# Patient Record
Sex: Male | Born: 1955
Health system: Southern US, Community
[De-identification: ages and names within clinical notes are randomized; demographics above are authoritative.]

## PROBLEM LIST (undated history)

## (undated) DIAGNOSIS — I10 Essential (primary) hypertension: Secondary | ICD-10-CM

## (undated) DIAGNOSIS — I2510 Atherosclerotic heart disease of native coronary artery without angina pectoris: Secondary | ICD-10-CM

## (undated) DIAGNOSIS — E669 Obesity, unspecified: Secondary | ICD-10-CM

## (undated) DIAGNOSIS — G4730 Sleep apnea, unspecified: Secondary | ICD-10-CM

## (undated) DIAGNOSIS — E785 Hyperlipidemia, unspecified: Secondary | ICD-10-CM

## (undated) HISTORY — DX: Obesity, unspecified: E66.9

## (undated) HISTORY — DX: Atherosclerotic heart disease of native coronary artery without angina pectoris: I25.10

## (undated) HISTORY — DX: Essential (primary) hypertension: I10

## (undated) HISTORY — DX: Hyperlipidemia, unspecified: E78.5

---

## 1986-01-04 HISTORY — PX: OTHER SURGICAL HISTORY: SHX169

## 2003-08-07 ENCOUNTER — Other Ambulatory Visit: Payer: Self-pay

## 2008-03-21 ENCOUNTER — Ambulatory Visit: Payer: Self-pay | Admitting: Internal Medicine

## 2008-03-21 HISTORY — PX: OTHER SURGICAL HISTORY: SHX169

## 2008-08-09 ENCOUNTER — Emergency Department: Payer: Self-pay | Admitting: Emergency Medicine

## 2008-11-01 ENCOUNTER — Ambulatory Visit: Payer: Self-pay | Admitting: Unknown Physician Specialty

## 2008-11-01 LAB — HM COLONOSCOPY

## 2009-01-15 LAB — PSA: PSA: 0.4

## 2010-02-03 IMAGING — CT CT HEAD WITHOUT CONTRAST
2 series · 16 of 30 positions shown, 20 images · non-contrast
Comparison: none

REASON FOR EXAM: board fell on head, takes Plavix, +nose bleed
(controlled)
COMMENTS:

PROCEDURE:     CT  - CT HEAD WITHOUT CONTRAST  - August 09, 2008  [DATE]
RESULT:     Comparison:  None
TECHNIQUE: Multiple axial images from the foramen magnum to the vertex were
obtained without IV contrast.

[Series 2: without · axial · non-contrast · 0.45mm/px · z∈[-150,-5]mm · 13 of 35 slices shown, 17 images]
[im 3/35  brain]
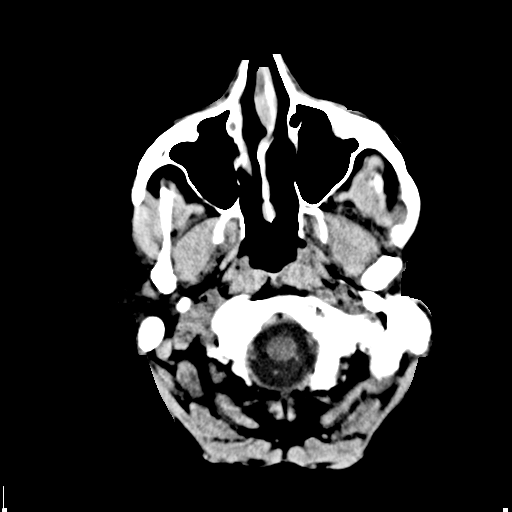
[im 3/35  bone]
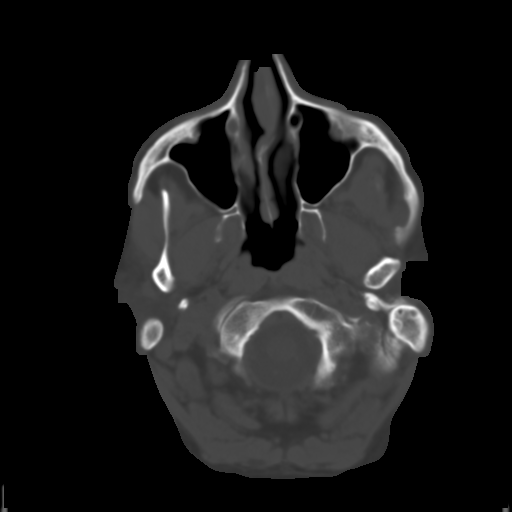
[im 5/35  brain]
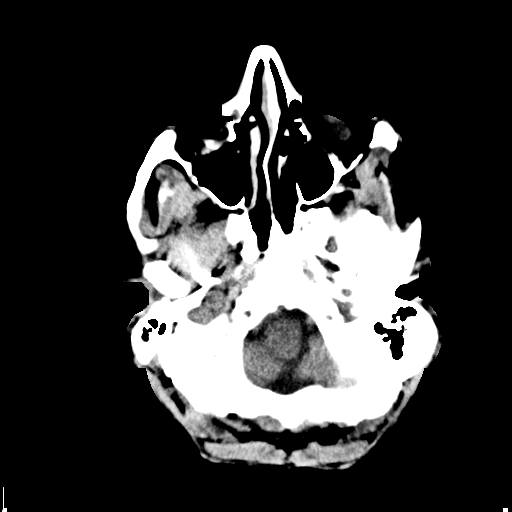
[im 8/35  brain]
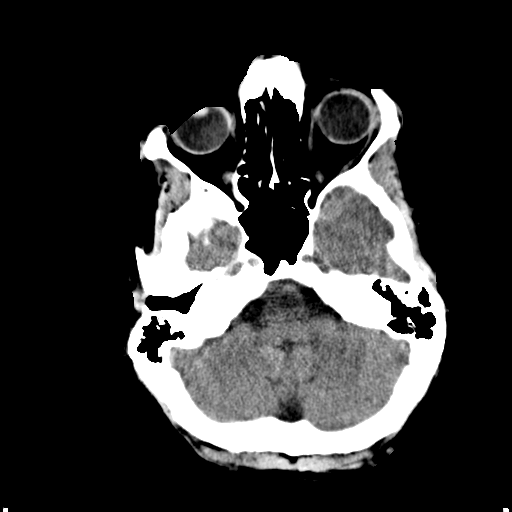
[im 10/35  brain]
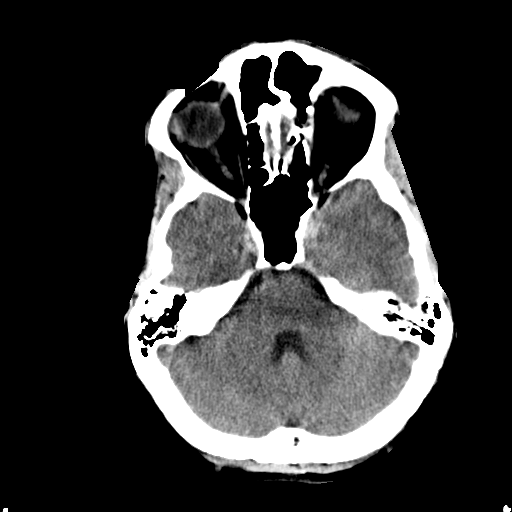
[im 13/35  brain]
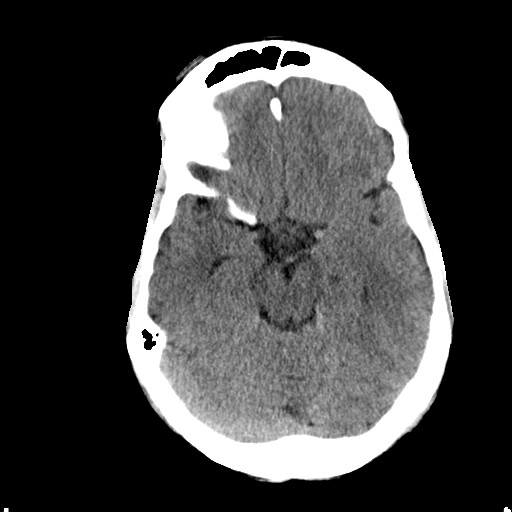
[im 13/35  bone]
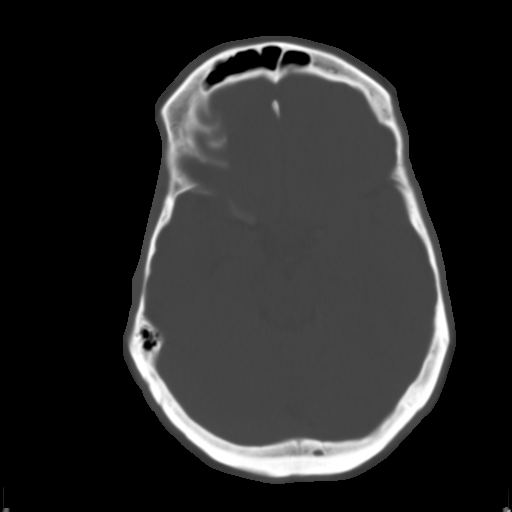
[im 15/35  brain]
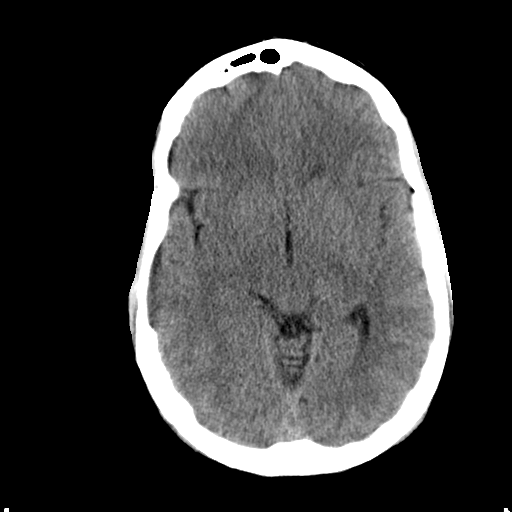
[im 18/35  brain]
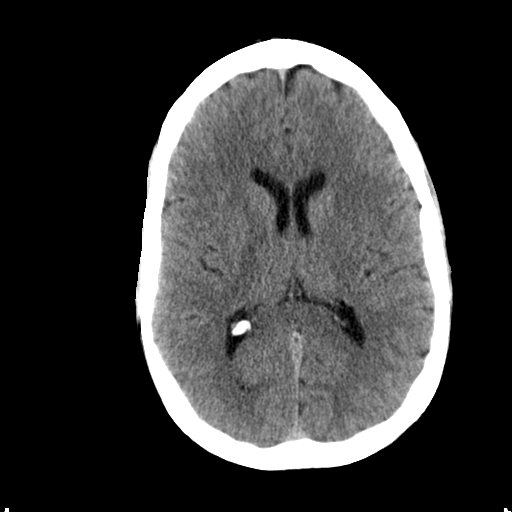
[im 20/35  brain]
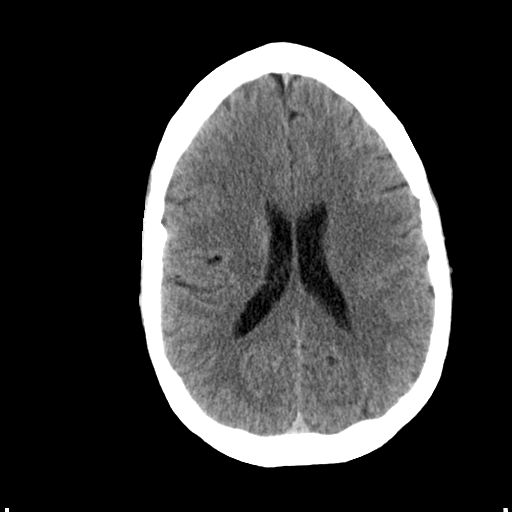
[im 22/35  brain]
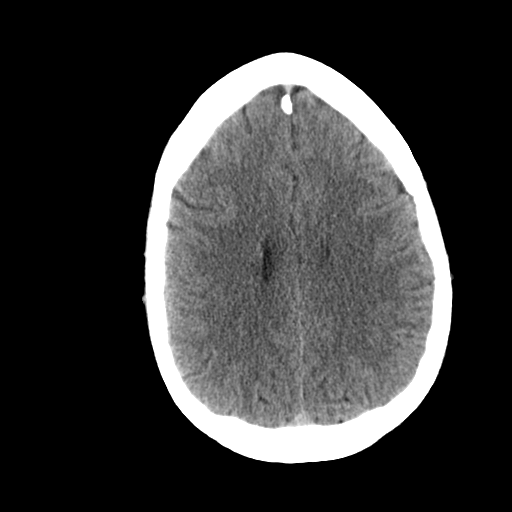
[im 22/35  bone]
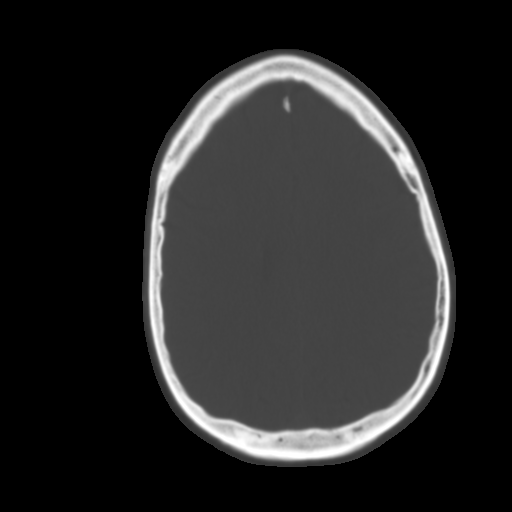
[im 25/35  brain]
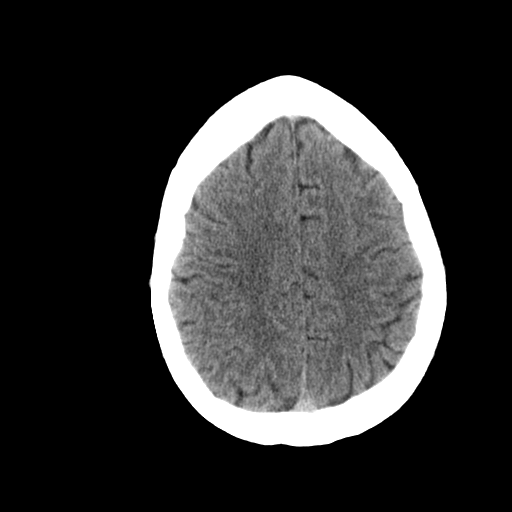
[im 27/35  brain]
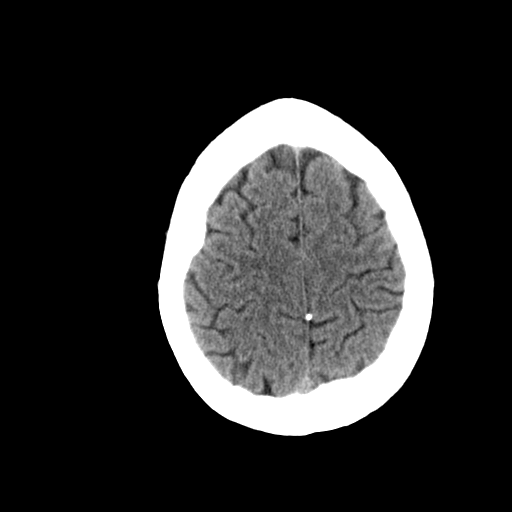
[im 30/35  brain]
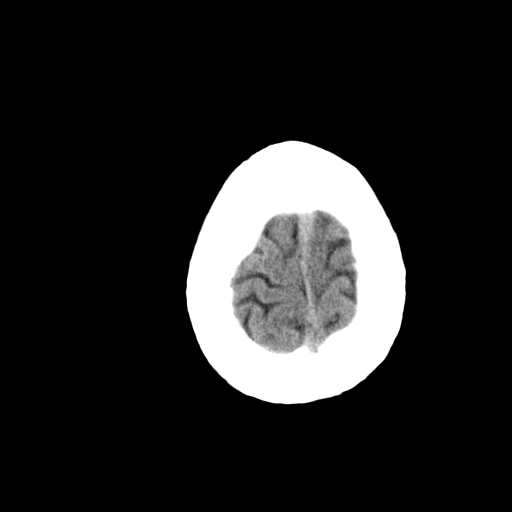
[im 32/35  brain]
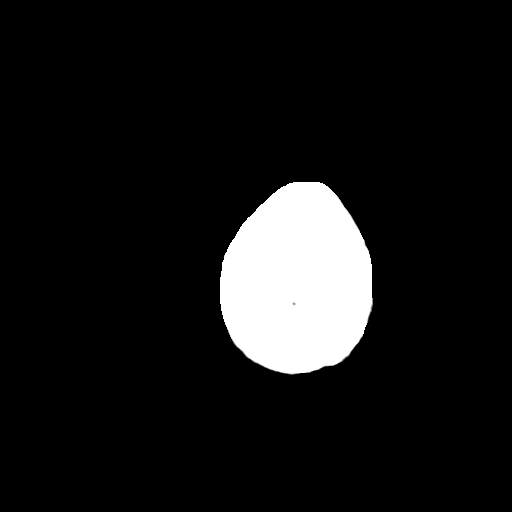
[im 32/35  bone]
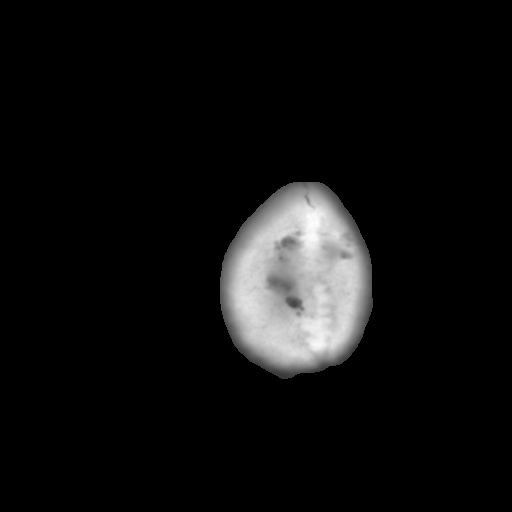

[Series 3: bone · axial · 0.45mm/px · z∈[-150,-100]mm · 3 of 35 slices shown]
[im 3/35  bone]
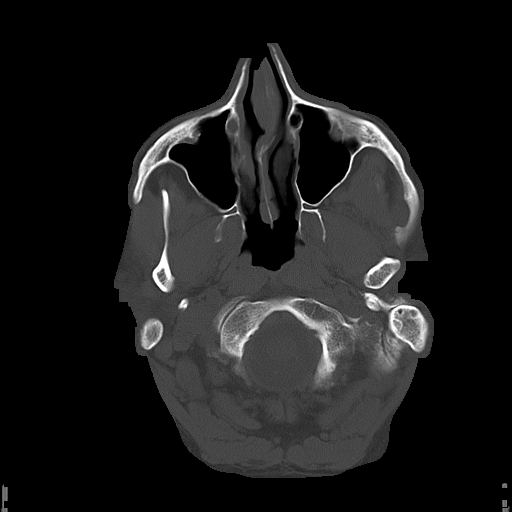
[im 8/35  bone]
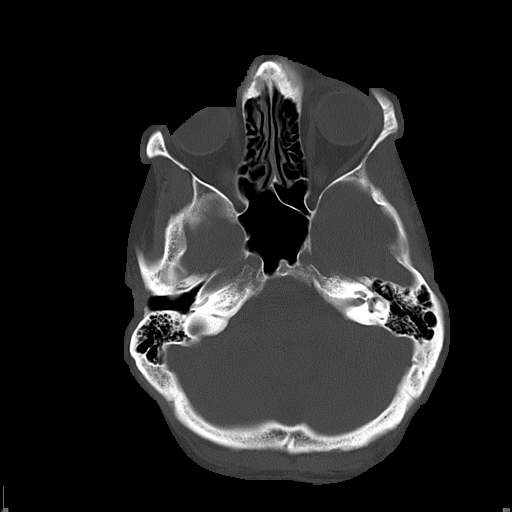
[im 13/35  bone]
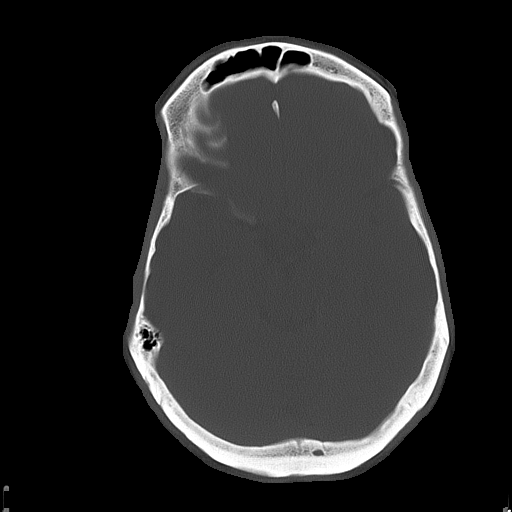

[16 of 30 positions shown; findings below may reference images not displayed]

FINDINGS: There is no evidence of mass effect, midline shift, or extra-axial fluid
collections.  There is no evidence of a space-occupying lesion or
intracranial hemorrhage. There is no evidence of a cortical-based area of
acute infarction.

The ventricles and sulci are appropriate for the patient's age. The basal
cisterns are patent.

Visualized portions of the orbits are unremarkable. The paranasal sinuses
and mastoid air cells are unremarkable.

The osseous structures are unremarkable.
IMPRESSION: No acute intracranial process.

## 2013-02-09 LAB — CBC AND DIFFERENTIAL
HCT: 43 % (ref 41–53)
Hemoglobin: 15 g/dL (ref 13.5–17.5)
Platelets: 231 10*3/uL (ref 150–399)
WBC: 7 10^3/mL

## 2013-02-09 LAB — LIPID PANEL
CHOLESTEROL: 139 mg/dL (ref 0–200)
HDL: 50 mg/dL (ref 35–70)
LDL Cholesterol: 73 mg/dL
TRIGLYCERIDES: 78 mg/dL (ref 40–160)

## 2013-02-09 LAB — HEPATIC FUNCTION PANEL
ALT: 25 U/L (ref 10–40)
AST: 31 U/L (ref 14–40)

## 2013-02-09 LAB — BASIC METABOLIC PANEL
BUN: 20 mg/dL (ref 4–21)
Creatinine: 1.2 mg/dL (ref 0.6–1.3)
Glucose: 89 mg/dL
POTASSIUM: 4.7 mmol/L (ref 3.4–5.3)
SODIUM: 140 mmol/L (ref 137–147)

## 2013-02-09 LAB — TSH: TSH: 1.87 u[IU]/mL (ref 0.41–5.90)

## 2014-07-18 ENCOUNTER — Other Ambulatory Visit: Payer: Self-pay | Admitting: Family Medicine

## 2014-07-18 NOTE — Telephone Encounter (Signed)
Patient has not been seen since Feb 2015.  Do you want him to have any?

## 2014-07-19 NOTE — Telephone Encounter (Signed)
Needs appointment within 3 months

## 2014-07-22 ENCOUNTER — Other Ambulatory Visit: Payer: Self-pay | Admitting: Family Medicine

## 2014-10-22 ENCOUNTER — Ambulatory Visit (INDEPENDENT_AMBULATORY_CARE_PROVIDER_SITE_OTHER): Payer: BLUE CROSS/BLUE SHIELD | Admitting: Family Medicine

## 2014-10-22 ENCOUNTER — Encounter: Payer: Self-pay | Admitting: Family Medicine

## 2014-10-22 VITALS — BP 114/72 | HR 72 | Temp 97.8°F | Resp 16 | Ht 74.0 in | Wt 245.0 lb

## 2014-10-22 DIAGNOSIS — G4733 Obstructive sleep apnea (adult) (pediatric): Secondary | ICD-10-CM | POA: Insufficient documentation

## 2014-10-22 DIAGNOSIS — N529 Male erectile dysfunction, unspecified: Secondary | ICD-10-CM | POA: Insufficient documentation

## 2014-10-22 DIAGNOSIS — Z23 Encounter for immunization: Secondary | ICD-10-CM

## 2014-10-22 DIAGNOSIS — I2510 Atherosclerotic heart disease of native coronary artery without angina pectoris: Secondary | ICD-10-CM | POA: Insufficient documentation

## 2014-10-22 DIAGNOSIS — I10 Essential (primary) hypertension: Secondary | ICD-10-CM | POA: Insufficient documentation

## 2014-10-22 DIAGNOSIS — E785 Hyperlipidemia, unspecified: Secondary | ICD-10-CM | POA: Insufficient documentation

## 2014-10-22 DIAGNOSIS — Z1211 Encounter for screening for malignant neoplasm of colon: Secondary | ICD-10-CM | POA: Diagnosis not present

## 2014-10-22 DIAGNOSIS — B009 Herpesviral infection, unspecified: Secondary | ICD-10-CM | POA: Insufficient documentation

## 2014-10-22 DIAGNOSIS — E669 Obesity, unspecified: Secondary | ICD-10-CM | POA: Insufficient documentation

## 2014-10-22 DIAGNOSIS — Z125 Encounter for screening for malignant neoplasm of prostate: Secondary | ICD-10-CM | POA: Diagnosis not present

## 2014-10-22 DIAGNOSIS — Z0000 Encounter for general adult medical examination without abnormal findings: Secondary | ICD-10-CM | POA: Diagnosis not present

## 2014-10-22 LAB — POCT URINALYSIS DIPSTICK
Bilirubin, UA: NEGATIVE
Glucose, UA: NEGATIVE
Ketones, UA: NEGATIVE
Leukocytes, UA: NEGATIVE
Nitrite, UA: NEGATIVE
PH UA: 6
PROTEIN UA: NEGATIVE
RBC UA: NEGATIVE
SPEC GRAV UA: 1.01
UROBILINOGEN UA: NEGATIVE

## 2014-10-22 MED ORDER — CLOPIDOGREL BISULFATE 75 MG PO TABS: 75.0000 mg | ORAL_TABLET | Freq: Every day | ORAL | Status: DC

## 2014-10-22 MED ORDER — ACYCLOVIR 400 MG PO TABS: 400.0000 mg | ORAL_TABLET | Freq: Three times a day (TID) | ORAL | Status: DC

## 2014-10-22 MED ORDER — ROSUVASTATIN CALCIUM 20 MG PO TABS: 20.0000 mg | ORAL_TABLET | Freq: Every day | ORAL | Status: DC

## 2014-10-22 MED ORDER — SILDENAFIL CITRATE 20 MG PO TABS: 20.0000 mg | ORAL_TABLET | Freq: Three times a day (TID) | ORAL | Status: DC

## 2014-10-22 NOTE — Progress Notes (Signed)
Patient ID: BLAYDEN CONWELL, male   DOB: 07-18-1955, 59 y.o.   MRN: [id number] Patient: Frank Hester, Male    DOB: 02-24-1955, 59 y.o.   MRN: [id number] Visit Date: 10/22/2014  Today's Provider: Megan Mans, MD   Chief Complaint  Patient presents with  . Annual Exam   Subjective:  Frank Hester is a 59 y.o. male who presents today for health maintenance and complete physical. He feels well. He reports exercising 2-3 times per week. He reports he is sleeping well.   Review of Systems  Constitutional: Negative for fever, chills, diaphoresis, activity change, appetite change, fatigue and unexpected weight change.  HENT: Positive for mouth sores. Negative for congestion, dental problem, drooling, ear discharge, ear pain, facial swelling, hearing loss, nosebleeds, postnasal drip, rhinorrhea, sinus pressure, sneezing, sore throat, tinnitus, trouble swallowing and voice change.   Eyes: Negative for photophobia, pain, discharge, redness, itching and visual disturbance.  Respiratory: Negative for apnea, cough, choking, chest tightness, shortness of breath, wheezing and stridor.   Cardiovascular: Negative for chest pain, palpitations and leg swelling.  Gastrointestinal: Negative for nausea, vomiting, abdominal pain, diarrhea, constipation, blood in stool, abdominal distention, anal bleeding and rectal pain.  Endocrine: Negative for cold intolerance, heat intolerance, polydipsia, polyphagia and polyuria.  Genitourinary: Negative for dysuria, urgency, frequency, hematuria, flank pain, decreased urine volume, discharge, penile swelling, scrotal swelling, enuresis, difficulty urinating, genital sores, penile pain and testicular pain.  Musculoskeletal: Positive for joint swelling. Negative for myalgias, back pain, arthralgias, gait problem, neck pain and neck stiffness.  Skin: Negative for color change and pallor.  Allergic/Immunologic: Negative for environmental allergies, food allergies  and immunocompromised state.  Neurological: Negative for dizziness, tremors, seizures, syncope, facial asymmetry, speech difficulty, weakness, light-headedness, numbness and headaches.  Hematological: Negative for adenopathy. Does not bruise/bleed easily.  Psychiatric/Behavioral: Negative for suicidal ideas, hallucinations, behavioral problems, confusion, sleep disturbance, self-injury, dysphoric mood, decreased concentration and agitation. The patient is not nervous/anxious and is not hyperactive.     Social History   Social History  . Marital Status: Married    Spouse Name: N/A  . Number of Children: N/A  . Years of Education: N/A   Occupational History  . Not on file.   Social History Main Topics  . Smoking status: Former Games developer  . Smokeless tobacco: Not on file     Comment: Used chewing tobacco, smoked cigarettes, smoked for about 20 years. Quit smoking in the 1990s  . Alcohol Use: No  . Drug Use: No  . Sexual Activity: Not on file   Other Topics Concern  . Not on file   Social History Narrative    Patient Active Problem List   Diagnosis Date Noted  . Arteriosclerosis of coronary artery 10/22/2014  . Herpes simplex 10/22/2014  . Failure of erection 10/22/2014  . HLD (hyperlipidemia) 10/22/2014  . BP (high blood pressure) 10/22/2014  . Adiposity 10/22/2014  . Obstructive apnea 10/22/2014    Past Surgical History  Procedure Laterality Date  . Status post coranary artery stent placement  03/21/2008  . Status post kneesurgery Left 1988    His family history includes Cancer in his maternal grandfather; Heart disease in his father and paternal uncle.    Outpatient Prescriptions Prior to Visit  Medication Sig Dispense Refill  . acyclovir (ZOVIRAX) 400 MG tablet Take by mouth.    Marland Kitchen aspirin 81 MG tablet Take by mouth.    . clopidogrel (PLAVIX) 75 MG tablet Take 75 mg by mouth daily.  12  . CRESTOR 20 MG tablet TAKE 1 TABLET AT BEDTIME 30 tablet 2  . NON FORMULARY  CPAP (device) for sleep apnea    . VIAGRA 100 MG tablet TAKE 1/2 TO 1 TABLET BY MOUTH DAILY AS NEEDED 30 tablet 1   No facility-administered medications prior to visit.    Patient Care Team: Maple Hudsonichard L Milagros Middendorf Jr., MD as PCP - General (Family Medicine)     Objective:   Vitals:  Filed Vitals:   10/22/14 1404  BP: 114/72  Pulse: 72  Temp: 97.8 F (36.6 C)  TempSrc: Oral  Resp: 16  Height: 6\' 2"  (1.88 m)  Weight: 245 lb (111.131 kg)    Physical Exam  Constitutional: He is oriented to person, place, and time. He appears well-developed and well-nourished.  HENT:  Head: Normocephalic and atraumatic.  Right Ear: External ear normal.  Left Ear: External ear normal.  Nose: Nose normal.  Mouth/Throat: Oropharynx is clear and moist.  Eyes: Conjunctivae and EOM are normal. Pupils are equal, round, and reactive to light.  Neck: Normal range of motion. Neck supple.  Cardiovascular: Normal rate, regular rhythm, normal heart sounds and intact distal pulses.   Pulmonary/Chest: Effort normal and breath sounds normal.  Abdominal: Soft. Bowel sounds are normal.  Genitourinary: Rectum normal, prostate normal and penis normal.  Musculoskeletal: Normal range of motion.  Neurological: He is alert and oriented to person, place, and time. He has normal reflexes.  Skin: Skin is warm and dry.  Psychiatric: He has a normal mood and affect. His behavior is normal. Judgment and thought content normal.     Depression Screen No flowsheet data found.    Assessment & Plan:     Routine Health Maintenance and Physical Exam  Exercise Activities and Dietary recommendations Goals    None      Immunization History  Administered Date(s) Administered  . Pneumococcal Polysaccharide-23 09/18/2009  . Tdap 01/15/2009    Health Maintenance  Topic Date Due  . Hepatitis C Screening  10/10/1955  . HIV Screening  06/06/1972  . TETANUS/TDAP  06/06/1976  . COLONOSCOPY  06/07/2007  . INFLUENZA  VACCINE  08/05/2014      Discussed health benefits of physical activity, and encouraged him to engage in regular exercise appropriate for his age and condition.    ------------------------------------------------------------------------------------------------------------

## 2014-10-26 ENCOUNTER — Other Ambulatory Visit: Payer: Self-pay | Admitting: Family Medicine

## 2014-10-28 ENCOUNTER — Encounter: Payer: Self-pay | Admitting: Family Medicine

## 2014-10-29 LAB — COMPREHENSIVE METABOLIC PANEL
A/G RATIO: 1.8 (ref 1.1–2.5)
ALK PHOS: 46 IU/L (ref 39–117)
ALT: 21 IU/L (ref 0–44)
AST: 25 IU/L (ref 0–40)
Albumin: 4.6 g/dL (ref 3.5–5.5)
BILIRUBIN TOTAL: 0.6 mg/dL (ref 0.0–1.2)
BUN/Creatinine Ratio: 11 (ref 9–20)
BUN: 13 mg/dL (ref 6–24)
CHLORIDE: 99 mmol/L (ref 97–106)
CO2: 24 mmol/L (ref 18–29)
Calcium: 9.8 mg/dL (ref 8.7–10.2)
Creatinine, Ser: 1.19 mg/dL (ref 0.76–1.27)
GFR calc non Af Amer: 67 mL/min/{1.73_m2} (ref >59)
GFR, EST AFRICAN AMERICAN: 78 mL/min/{1.73_m2} (ref >59)
GLUCOSE: 95 mg/dL (ref 65–99)
Globulin, Total: 2.5 g/dL (ref 1.5–4.5)
POTASSIUM: 4.9 mmol/L (ref 3.5–5.2)
Sodium: 139 mmol/L (ref 136–144)
Total Protein: 7.1 g/dL (ref 6.0–8.5)

## 2014-10-29 LAB — LIPID PANEL WITH LDL/HDL RATIO
CHOLESTEROL TOTAL: 141 mg/dL (ref 100–199)
HDL: 51 mg/dL (ref >39)
LDL CALC: 64 mg/dL (ref 0–99)
LDl/HDL Ratio: 1.3 ratio units (ref 0.0–3.6)
TRIGLYCERIDES: 130 mg/dL (ref 0–149)
VLDL CHOLESTEROL CAL: 26 mg/dL (ref 5–40)

## 2014-10-29 LAB — TSH: TSH: 1.49 u[IU]/mL (ref 0.450–4.500)

## 2014-10-29 LAB — PSA: Prostate Specific Ag, Serum: 0.5 ng/mL (ref 0.0–4.0)

## 2014-11-05 ENCOUNTER — Other Ambulatory Visit: Payer: Self-pay

## 2015-01-16 ENCOUNTER — Encounter: Payer: Self-pay | Admitting: *Deleted

## 2015-01-17 ENCOUNTER — Ambulatory Visit: Payer: BLUE CROSS/BLUE SHIELD | Admitting: Certified Registered Nurse Anesthetist

## 2015-01-17 ENCOUNTER — Encounter
Admission: RE | Disposition: A | Discharge status: Home / Self Care | Payer: Self-pay | Source: Ambulatory Visit | Attending: Unknown Physician Specialty

## 2015-01-17 ENCOUNTER — Ambulatory Visit
Admission: RE | Admit: 2015-01-17 | Discharge: 2015-01-17 | Disposition: A | Discharge status: Home / Self Care | Payer: BLUE CROSS/BLUE SHIELD | Source: Ambulatory Visit | Attending: Unknown Physician Specialty | Admitting: Unknown Physician Specialty

## 2015-01-17 ENCOUNTER — Encounter: Payer: Self-pay | Admitting: *Deleted

## 2015-01-17 DIAGNOSIS — Z7982 Long term (current) use of aspirin: Secondary | ICD-10-CM | POA: Insufficient documentation

## 2015-01-17 DIAGNOSIS — E785 Hyperlipidemia, unspecified: Secondary | ICD-10-CM | POA: Insufficient documentation

## 2015-01-17 DIAGNOSIS — I10 Essential (primary) hypertension: Secondary | ICD-10-CM | POA: Diagnosis not present

## 2015-01-17 DIAGNOSIS — Z8249 Family history of ischemic heart disease and other diseases of the circulatory system: Secondary | ICD-10-CM | POA: Insufficient documentation

## 2015-01-17 DIAGNOSIS — Z800 Family history of malignant neoplasm of digestive organs: Secondary | ICD-10-CM | POA: Insufficient documentation

## 2015-01-17 DIAGNOSIS — Z79899 Other long term (current) drug therapy: Secondary | ICD-10-CM | POA: Diagnosis not present

## 2015-01-17 DIAGNOSIS — Z9889 Other specified postprocedural states: Secondary | ICD-10-CM | POA: Diagnosis not present

## 2015-01-17 DIAGNOSIS — K635 Polyp of colon: Secondary | ICD-10-CM | POA: Diagnosis not present

## 2015-01-17 DIAGNOSIS — Z86010 Personal history of colonic polyps: Secondary | ICD-10-CM | POA: Diagnosis present

## 2015-01-17 DIAGNOSIS — Z7901 Long term (current) use of anticoagulants: Secondary | ICD-10-CM | POA: Diagnosis not present

## 2015-01-17 DIAGNOSIS — Z955 Presence of coronary angioplasty implant and graft: Secondary | ICD-10-CM | POA: Diagnosis not present

## 2015-01-17 DIAGNOSIS — Z1211 Encounter for screening for malignant neoplasm of colon: Secondary | ICD-10-CM | POA: Insufficient documentation

## 2015-01-17 DIAGNOSIS — G4730 Sleep apnea, unspecified: Secondary | ICD-10-CM | POA: Diagnosis not present

## 2015-01-17 DIAGNOSIS — K640 First degree hemorrhoids: Secondary | ICD-10-CM | POA: Diagnosis not present

## 2015-01-17 HISTORY — DX: Sleep apnea, unspecified: G47.30

## 2015-01-17 HISTORY — PX: COLONOSCOPY WITH PROPOFOL: SHX5780

## 2015-01-17 SURGERY — COLONOSCOPY WITH PROPOFOL
Anesthesia: General

## 2015-01-17 MED ORDER — SODIUM CHLORIDE 0.9 % IV SOLN
INTRAVENOUS | Status: DC
  Administered 2015-01-17: 11:00:00 via INTRAVENOUS

## 2015-01-17 MED ORDER — PHENYLEPHRINE HCL 10 MG/ML IJ SOLN
INTRAMUSCULAR | Status: DC | PRN
  Administered 2015-01-17 (×2): 50 ug via INTRAVENOUS
  Administered 2015-01-17 (×2): 100 ug via INTRAVENOUS

## 2015-01-17 MED ORDER — PROPOFOL 500 MG/50ML IV EMUL
INTRAVENOUS | Status: DC | PRN
  Administered 2015-01-17: 160 ug/kg/min via INTRAVENOUS

## 2015-01-17 MED ORDER — PROPOFOL 10 MG/ML IV BOLUS
INTRAVENOUS | Status: DC | PRN
  Administered 2015-01-17: 50 mg via INTRAVENOUS

## 2015-01-17 MED ORDER — EPHEDRINE SULFATE 50 MG/ML IJ SOLN
INTRAMUSCULAR | Status: DC | PRN
  Administered 2015-01-17: 5 mg via INTRAVENOUS

## 2015-01-17 MED ORDER — LIDOCAINE HCL (CARDIAC) 20 MG/ML IV SOLN
INTRAVENOUS | Status: DC | PRN
  Administered 2015-01-17: 40 mg via INTRAVENOUS

## 2015-01-17 MED ORDER — SODIUM CHLORIDE 0.9 % IV SOLN: INTRAVENOUS | Status: DC

## 2015-01-17 NOTE — H&P (Signed)
   Primary Care Physician:  Megan Mansichard Gilbert Jr, MD Primary Gastroenterologist:  Dr. Mechele CollinElliott  Pre-Procedure History & Physical: HPI:  Frank Hester is a 60 y.o. male is here for an colonoscopy.   Past Medical History  Diagnosis Date  . Hyperlipidemia   . Hypertension   . Sleep apnea     Past Surgical History  Procedure Laterality Date  . Status post coranary artery stent placement  03/21/2008  . Status post kneesurgery Left 1988    Prior to Admission medications   Medication Sig Start Date End Date Taking? Authorizing Provider  rosuvastatin (CRESTOR) 20 MG tablet Take 1 tablet (20 mg total) by mouth at bedtime. 10/22/14  Yes Richard Hulen ShoutsL Gilbert Jr., MD  acyclovir (ZOVIRAX) 400 MG tablet Take 1 tablet (400 mg total) by mouth 3 (three) times daily. 10/22/14   Maple Hudsonichard L Gilbert Jr., MD  aspirin 81 MG tablet Take by mouth. 02/05/10   Historical Provider, MD  clopidogrel (PLAVIX) 75 MG tablet Take 1 tablet (75 mg total) by mouth daily. 10/22/14   Richard Hulen ShoutsL Gilbert Jr., MD  NON FORMULARY CPAP (device) for sleep apnea    Historical Provider, MD  rosuvastatin (CRESTOR) 20 MG tablet TAKE 1 TABLET AT BEDTIME 10/26/14   Richard Hulen ShoutsL Gilbert Jr., MD  sildenafil (REVATIO) 20 MG tablet Take 1 tablet (20 mg total) by mouth 3 (three) times daily. 10/22/14   Richard Hulen ShoutsL Gilbert Jr., MD    Allergies as of 12/24/2014  . (No Known Allergies)    Family History  Problem Relation Age of Onset  . Heart disease Father     died from MI  . Heart disease Paternal Uncle   . Cancer Maternal Grandfather     stomach    Social History   Social History  . Marital Status: Married    Spouse Name: N/A  . Number of Children: N/A  . Years of Education: N/A   Occupational History  . Not on file.   Social History Main Topics  . Smoking status: Current Every Day Smoker  . Smokeless tobacco: Former NeurosurgeonUser    Quit date: 01/04/1993     Comment: Used chewing tobacco, smoked cigarettes, smoked for about 20  years. Quit smoking in the 1990s  . Alcohol Use: No  . Drug Use: No  . Sexual Activity: Not on file   Other Topics Concern  . Not on file   Social History Narrative    Review of Systems: See HPI, otherwise negative ROS  Physical Exam: BP 113/79 mmHg  Pulse 80  Temp(Src) 97.6 F (36.4 C) (Tympanic)  Resp 20  Ht 6\' 3"  (1.905 m)  Wt 113.399 kg (250 lb)  BMI 31.25 kg/m2  SpO2 100% General:   Alert,  pleasant and cooperative in NAD Head:  Normocephalic and atraumatic. Neck:  Supple; no masses or thyromegaly. Lungs:  Clear throughout to auscultation.    Heart:  Regular rate and rhythm. Abdomen:  Soft, nontender and nondistended. Normal bowel sounds, without guarding, and without rebound.   Neurologic:  Alert and  oriented x4;  grossly normal neurologically.  Impression/Plan: Frank Hester is here for an colonoscopy to [facility]  Risks, benefits, limitations, and alternatives regarding  colonoscopy have been reviewed with the patient.  Questions have been answered.  All parties agreeable.   Lynnae PrudeELLIOTT, Moise Friday, MD  01/17/2015, 10:55 AM

## 2015-01-17 NOTE — Anesthesia Preprocedure Evaluation (Signed)
Anesthesia Evaluation  Patient identified by MRN, date of birth, ID band Patient awake    Reviewed: Allergy & Precautions, H&P , NPO status , Patient's Chart, lab work & pertinent test results, reviewed documented beta blocker date and time   Airway Mallampati: II  TM Distance: >3 FB Neck ROM: full    Dental no notable dental hx.    Pulmonary neg pulmonary ROS, Current Smoker,    Pulmonary exam normal breath sounds clear to auscultation       Cardiovascular Exercise Tolerance: Good hypertension, negative cardio ROS   Rhythm:regular Rate:Normal     Neuro/Psych negative neurological ROS  negative psych ROS   GI/Hepatic negative GI ROS, Neg liver ROS,   Endo/Other  negative endocrine ROS  Renal/GU negative Renal ROS  negative genitourinary   Musculoskeletal   Abdominal   Peds  Hematology negative hematology ROS (+)   Anesthesia Other Findings   Reproductive/Obstetrics negative OB ROS                             Anesthesia Physical Anesthesia Plan  ASA: III  Anesthesia Plan: General   Post-op Pain Management:    Induction:   Airway Management Planned:   Additional Equipment:   Intra-op Plan:   Post-operative Plan:   Informed Consent: I have reviewed the patients History and Physical, chart, labs and discussed the procedure including the risks, benefits and alternatives for the proposed anesthesia with the patient or authorized representative who has indicated his/her understanding and acceptance.   Dental Advisory Given  Plan Discussed with: CRNA  Anesthesia Plan Comments:         Anesthesia Quick Evaluation

## 2015-01-17 NOTE — Transfer of Care (Signed)
Immediate Anesthesia Transfer of Care Note  Patient: Frank Hester  Procedure(s) Performed: Procedure(s): COLONOSCOPY WITH PROPOFOL (N/A)  Patient Location: PACU  Anesthesia Type:General  Level of Consciousness: sedated  Airway & Oxygen Therapy: Patient Spontanous Breathing and Patient connected to nasal cannula oxygen  Post-op Assessment: Report given to RN and Post -op Vital signs reviewed and stable  Post vital signs: Reviewed and stable  Last Vitals:  Filed Vitals:   01/17/15 1015  BP: 113/79  Pulse: 80  Temp: 36.4 C  Resp: 20    Complications: No apparent anesthesia complications

## 2015-01-17 NOTE — Op Note (Signed)
[facility] Gastroenterology Patient Name: Frank Hester Procedure Date: 01/17/2015 10:56 AM MRN: [id number] Account #: [id number] Date of Birth: 03/26/1955 Admit Type: Outpatient Age: 60 Room: [facility] ENDO ROOM 1 Gender: Male Note Status: Finalized Procedure:         Colonoscopy Indications:       High risk colon cancer surveillance: Personal history of                     colonic polyps Providers:         Scot Jun, MD Referring MD:      Ferdinand Lango. Sullivan Lone, MD (Referring MD) Medicines:         Propofol per Anesthesia Complications:     No immediate complications. Procedure:         Pre-Anesthesia Assessment:                    - After reviewing the risks and benefits, the patient was                     deemed in satisfactory condition to undergo the procedure.                    After obtaining informed consent, the colonoscope was                     passed under direct vision. Throughout the procedure, the                     patient's blood pressure, pulse, and oxygen saturations                     were monitored continuously. The Colonoscope was                     introduced through the anus and advanced to the the cecum,                     identified by appendiceal orifice and ileocecal valve. The                     colonoscopy was somewhat difficult due to significant                     looping. Successful completion of the procedure was aided                     by applying abdominal pressure. The patient tolerated the                     procedure well. The quality of the bowel preparation was                     good. Findings:      A diminutive polyp was found at the splenic flexure. The polyp was       sessile. The polyp was removed with a cold snare. Resection and       retrieval were complete.      Internal hemorrhoids were found during endoscopy. The hemorrhoids were       small and Grade I (internal hemorrhoids that do not  prolapse).      The exam was otherwise without abnormality. Impression:        - One diminutive polyp at the splenic flexure. Resected  and retrieved.                    - Internal hemorrhoids.                    - The examination was otherwise normal. Recommendation:    - Await pathology results. Scot Junobert T Elliott, MD 01/17/2015 11:27:58 AM This report has been signed electronically. Number of Addenda: 0 Note Initiated On: 01/17/2015 10:56 AM Scope Withdrawal Time: 0 hours 11 minutes 44 seconds  Total Procedure Duration: 0 hours 23 minutes 56 seconds       [facility]

## 2015-01-18 NOTE — Progress Notes (Signed)
Voicemail Reached.  No Message Left. 

## 2015-01-19 NOTE — Anesthesia Postprocedure Evaluation (Signed)
Anesthesia Post Note  Patient: Frank Hester  Procedure(s) Performed: Procedure(s) (LRB): COLONOSCOPY WITH PROPOFOL (N/A)  Patient location during evaluation: PACU Anesthesia Type: General Level of consciousness: awake and alert Pain management: pain level controlled Vital Signs Assessment: post-procedure vital signs reviewed and stable Respiratory status: spontaneous breathing, nonlabored ventilation, respiratory function stable and patient connected to nasal cannula oxygen Cardiovascular status: blood pressure returned to baseline and stable Postop Assessment: no signs of nausea or vomiting Anesthetic complications: no    Last Vitals:  Filed Vitals:   01/17/15 1150 01/17/15 1155  BP: 119/80 124/81  Pulse: 66 63  Temp:    Resp: 16 12    Last Pain:  Filed Vitals:   01/17/15 1158  PainSc: 0-No pain                 Yevette EdwardsJames G Adams

## 2015-01-20 LAB — SURGICAL PATHOLOGY

## 2015-01-22 ENCOUNTER — Encounter: Payer: Self-pay | Admitting: Unknown Physician Specialty

## 2015-10-23 ENCOUNTER — Encounter: Payer: BLUE CROSS/BLUE SHIELD | Admitting: Family Medicine

## 2015-11-02 ENCOUNTER — Other Ambulatory Visit: Payer: Self-pay | Admitting: Family Medicine

## 2015-12-18 ENCOUNTER — Other Ambulatory Visit: Payer: Self-pay | Admitting: Family Medicine

## 2016-01-26 ENCOUNTER — Other Ambulatory Visit: Payer: Self-pay | Admitting: Family Medicine

## 2016-03-01 ENCOUNTER — Other Ambulatory Visit: Payer: Self-pay | Admitting: Family Medicine

## 2016-04-08 ENCOUNTER — Other Ambulatory Visit: Payer: Self-pay | Admitting: Family Medicine

## 2016-05-06 ENCOUNTER — Other Ambulatory Visit: Payer: Self-pay | Admitting: Physician Assistant

## 2016-05-06 NOTE — Telephone Encounter (Signed)
Please review-aa 

## 2016-05-07 ENCOUNTER — Other Ambulatory Visit: Payer: Self-pay | Admitting: Physician Assistant

## 2016-05-27 ENCOUNTER — Other Ambulatory Visit: Payer: Self-pay | Admitting: Family Medicine

## 2016-05-27 DIAGNOSIS — E785 Hyperlipidemia, unspecified: Secondary | ICD-10-CM

## 2016-05-27 NOTE — Telephone Encounter (Signed)
Pt contacted office for refill request on the following medications:  rosuvastatin (CRESTOR) 20 MG tablet.  [facility] [address]  [phone]/MW  Pt has an appointment 06/02/16/MW

## 2016-05-28 MED ORDER — ROSUVASTATIN CALCIUM 20 MG PO TABS: ORAL_TABLET | ORAL | 0 refills | Status: DC

## 2016-06-02 ENCOUNTER — Encounter: Payer: Self-pay | Admitting: Family Medicine

## 2016-06-02 ENCOUNTER — Ambulatory Visit (INDEPENDENT_AMBULATORY_CARE_PROVIDER_SITE_OTHER): Payer: BLUE CROSS/BLUE SHIELD | Admitting: Family Medicine

## 2016-06-02 VITALS — BP 122/64 | HR 94 | Temp 98.3°F | Resp 14 | Ht 74.0 in | Wt 258.0 lb

## 2016-06-02 DIAGNOSIS — Z0000 Encounter for general adult medical examination without abnormal findings: Secondary | ICD-10-CM | POA: Diagnosis not present

## 2016-06-02 DIAGNOSIS — E784 Other hyperlipidemia: Secondary | ICD-10-CM | POA: Diagnosis not present

## 2016-06-02 DIAGNOSIS — I2510 Atherosclerotic heart disease of native coronary artery without angina pectoris: Secondary | ICD-10-CM

## 2016-06-02 DIAGNOSIS — Z125 Encounter for screening for malignant neoplasm of prostate: Secondary | ICD-10-CM

## 2016-06-02 DIAGNOSIS — G4733 Obstructive sleep apnea (adult) (pediatric): Secondary | ICD-10-CM | POA: Diagnosis not present

## 2016-06-02 DIAGNOSIS — E7849 Other hyperlipidemia: Secondary | ICD-10-CM

## 2016-06-02 DIAGNOSIS — B009 Herpesviral infection, unspecified: Secondary | ICD-10-CM | POA: Diagnosis not present

## 2016-06-02 DIAGNOSIS — Z1211 Encounter for screening for malignant neoplasm of colon: Secondary | ICD-10-CM

## 2016-06-02 DIAGNOSIS — H6121 Impacted cerumen, right ear: Secondary | ICD-10-CM

## 2016-06-02 MED ORDER — SILDENAFIL CITRATE 20 MG PO TABS: 20.0000 mg | ORAL_TABLET | Freq: Three times a day (TID) | ORAL | 12 refills | Status: DC

## 2016-06-02 NOTE — Progress Notes (Signed)
Patient: Frank Hester, Male    DOB: 1955/10/31, 61 y.o.   MRN: [id number] Visit Date: 06/02/2016  Today's Provider: Megan Mans, MD   Chief Complaint  Patient presents with  . Annual Exam   Subjective:  Frank Hester is a 61 y.o. male who presents today for health maintenance and complete physical. He feels fairly well. He reports exercising has knee issues but does sometimes stationery bicycle. He reports he is sleeping well. Immunization History  Administered Date(s) Administered  . Influenza,inj,Quad PF,36+ Mos 10/22/2014  . Pneumococcal Polysaccharide-23 09/18/2009  . Tdap 01/15/2009  Last colonoscopy was 01/17/15-diminutive polyp, internal hemorrhoids, otherwise normal  Review of Systems  Constitutional: Negative.   HENT: Positive for dental problem, ear discharge, hearing loss and mouth sores.   Eyes: Negative.   Respiratory: Negative.   Cardiovascular: Positive for leg swelling.  Gastrointestinal: Negative.   Endocrine: Negative.   Genitourinary: Negative.   Musculoskeletal: Positive for arthralgias, back pain, neck pain and neck stiffness.  Skin: Negative.   Allergic/Immunologic: Negative.   Neurological: Negative.   Hematological: Negative.   Psychiatric/Behavioral: Negative.     Social History   Social History  . Marital status: Married    Spouse name: N/A  . Number of children: N/A  . Years of education: N/A   Occupational History  . Not on file.   Social History Main Topics  . Smoking status: Former Smoker    Packs/day: 0.25    Types: Cigars, Cigarettes    Quit date: 01/04/1993  . Smokeless tobacco: Former Neurosurgeon    Quit date: 01/04/1993     Comment: Used chewing tobacco, smoked cigarettes, smoked for about 20 years. Quit smoking in the 1990s  . Alcohol use No  . Drug use: No  . Sexual activity: Not on file   Other Topics Concern  . Not on file   Social History Narrative  . No narrative on file    Patient Active Problem List    Diagnosis Date Noted  . Arteriosclerosis of coronary artery 10/22/2014  . Herpes simplex 10/22/2014  . Failure of erection 10/22/2014  . HLD (hyperlipidemia) 10/22/2014  . BP (high blood pressure) 10/22/2014  . Adiposity 10/22/2014  . Obstructive apnea 10/22/2014    Past Surgical History:  Procedure Laterality Date  . COLONOSCOPY WITH PROPOFOL N/A 01/17/2015   Procedure: COLONOSCOPY WITH PROPOFOL;  Surgeon: Scot Jun, MD;  [facility] END[facility]COPY;  Service: Endoscopy;  Laterality: N/A;  . Status post coranary artery stent placement  03/21/2008  . Status post kneesurgery Left 1988    His family history includes Cancer in his maternal grandfather; Heart disease in his father and paternal uncle.     Outpatient Encounter Prescriptions as of 06/02/2016  Medication Sig Note  . acyclovir (ZOVIRAX) 400 MG tablet TAKE 1 TABLET (400 MG TOTAL) BY MOUTH 3 (THREE) TIMES DAILY.   Marland Kitchen aspirin 81 MG tablet Take by mouth. 10/22/2014: Received from: [facility]  . clopidogrel (PLAVIX) 75 MG tablet TAKE 1 TABLET (75 MG TOTAL) BY MOUTH DAILY.   . NON FORMULARY CPAP (device) for sleep apnea 10/22/2014: Received from: [facility] Received Sig:   . rosuvastatin (CRESTOR) 20 MG tablet TAKE 1 TABLET (20 MG TOTAL) BY MOUTH AT BEDTIME.   . sildenafil (REVATIO) 20 MG tablet Take 1 tablet (20 mg total) by mouth 3 (three) times daily. (Patient not taking: Reported on 06/02/2016)    No facility-administered encounter medications on file as of 06/02/2016.  Patient Care Team: Maple HudsonGilbert, Richard L Jr., MD as PCP - General (Family Medicine)      Objective:   Vitals:  Vitals:   06/02/16 1428  BP: 122/64  Pulse: 94  Resp: 14  Temp: 98.3 F (36.8 C)  Weight: 258 lb (117 kg)  Height: 6\' 2"  (1.88 m)    Physical Exam  Constitutional: He is oriented to person, place, and time. He appears well-developed and well-nourished.  HENT:  Head: Normocephalic and atraumatic.   Right Ear: External ear normal.  Left Ear: External ear normal.  Mouth/Throat: Oropharynx is clear and moist.  Excessive wax in the right ear  Eyes: Conjunctivae are normal. Pupils are equal, round, and reactive to light.  Neck: Normal range of motion. Neck supple.  Cardiovascular: Normal rate, regular rhythm, normal heart sounds and intact distal pulses.  Exam reveals no gallop.   No murmur heard. Pulmonary/Chest: Effort normal and breath sounds normal. No respiratory distress. He has no wheezes.  Abdominal: Soft. He exhibits no distension. There is no tenderness.  Genitourinary: Rectum normal, prostate normal and penis normal. Rectal exam shows guaiac negative stool. No penile tenderness.  Musculoskeletal: Normal range of motion. He exhibits no edema or tenderness.  Neurological: He is alert and oriented to person, place, and time. No cranial nerve deficit. Coordination normal.  Skin: No rash noted. No erythema.  Psychiatric: He has a normal mood and affect. His behavior is normal. Judgment and thought content normal.   Depression Screen PHQ 2/9 Scores 06/02/2016  PHQ - 2 Score 0  PHQ- 9 Score 1   Assessment & Plan:  1. Annual physical exam - EKG 12-Lead - CBC with Differential/Platelet - Comprehensive metabolic panel - Lipid Panel With LDL/HDL Ratio - TSH - POCT Urinalysis Dipstick  2. Prostate cancer screening - PSA  3. Colon cancer screening - IFOBT POC (occult bld, rslt in office); Future  4. Obstructive apnea  5. Other hyperlipidemia - Comprehensive metabolic panel - Lipid Panel With LDL/HDL Ratio  6. Herpes simplex  7. Arteriosclerosis of coronary artery - EKG 12-Lead.All risk factors treated.  8. Impacted cerumen of right ear Right ear irrigated.  HPI, Exam and A&P transcribed by Samara DeistAnastasiya Aleksandrova, RMA under direction and in the presence of Julieanne Mansonichard Gilbert, MD. I have done the exam and reviewed the chart and it is accurate to the best of my  knowledge. [employer]ist[employer]agon  technology has been used and  any errors in dictation or transcription are unintentional. Julieanne Mansonichard Gilbert M.D. [facility]

## 2016-06-09 LAB — COMPREHENSIVE METABOLIC PANEL
A/G RATIO: 1.6 (ref 1.2–2.2)
ALBUMIN: 4.1 g/dL (ref 3.5–5.5)
ALT: 23 IU/L (ref 0–44)
AST: 21 IU/L (ref 0–40)
Alkaline Phosphatase: 44 IU/L (ref 39–117)
BUN / CREAT RATIO: 15 (ref 9–20)
BUN: 15 mg/dL (ref 6–24)
Bilirubin Total: 0.4 mg/dL (ref 0.0–1.2)
CALCIUM: 9 mg/dL (ref 8.7–10.2)
CO2: 22 mmol/L (ref 18–29)
Chloride: 104 mmol/L (ref 96–106)
Creatinine, Ser: 0.97 mg/dL (ref 0.76–1.27)
GFR calc non Af Amer: 85 mL/min/{1.73_m2} (ref >59)
GFR, EST AFRICAN AMERICAN: 98 mL/min/{1.73_m2} (ref >59)
GLOBULIN, TOTAL: 2.5 g/dL (ref 1.5–4.5)
Glucose: 85 mg/dL (ref 65–99)
POTASSIUM: 4.5 mmol/L (ref 3.5–5.2)
SODIUM: 139 mmol/L (ref 134–144)
Total Protein: 6.6 g/dL (ref 6.0–8.5)

## 2016-06-09 LAB — LIPID PANEL WITH LDL/HDL RATIO
Cholesterol, Total: 124 mg/dL (ref 100–199)
HDL: 41 mg/dL (ref >39)
LDL CALC: 63 mg/dL (ref 0–99)
LDL/HDL RATIO: 1.5 ratio (ref 0.0–3.6)
Triglycerides: 101 mg/dL (ref 0–149)
VLDL Cholesterol Cal: 20 mg/dL (ref 5–40)

## 2016-06-09 LAB — CBC WITH DIFFERENTIAL/PLATELET
BASOS: 0 %
Basophils Absolute: 0 10*3/uL (ref 0.0–0.2)
EOS (ABSOLUTE): 0.1 10*3/uL (ref 0.0–0.4)
EOS: 1 %
HEMATOCRIT: 39.1 % (ref 37.5–51.0)
HEMOGLOBIN: 13.6 g/dL (ref 13.0–17.7)
IMMATURE GRANULOCYTES: 0 %
Immature Grans (Abs): 0 10*3/uL (ref 0.0–0.1)
LYMPHS ABS: 2 10*3/uL (ref 0.7–3.1)
Lymphs: 29 %
MCH: 30.4 pg (ref 26.6–33.0)
MCHC: 34.8 g/dL (ref 31.5–35.7)
MCV: 88 fL (ref 79–97)
MONOCYTES: 7 %
MONOS ABS: 0.5 10*3/uL (ref 0.1–0.9)
Neutrophils Absolute: 4.4 10*3/uL (ref 1.4–7.0)
Neutrophils: 63 %
Platelets: 218 10*3/uL (ref 150–379)
RBC: 4.47 x10E6/uL (ref 4.14–5.80)
RDW: 14.1 % (ref 12.3–15.4)
WBC: 6.9 10*3/uL (ref 3.4–10.8)

## 2016-06-09 LAB — PSA: Prostate Specific Ag, Serum: 0.5 ng/mL (ref 0.0–4.0)

## 2016-06-09 LAB — TSH: TSH: 2.02 u[IU]/mL (ref 0.450–4.500)

## 2016-06-15 NOTE — Progress Notes (Signed)
Advised  ED 

## 2016-06-16 LAB — POCT URINALYSIS DIPSTICK
BILIRUBIN UA: NEGATIVE
Blood, UA: NEGATIVE
GLUCOSE UA: NEGATIVE
Ketones, UA: NEGATIVE
Leukocytes, UA: NEGATIVE
Nitrite, UA: NEGATIVE
Protein, UA: NEGATIVE
Spec Grav, UA: 1.015 (ref 1.010–1.025)
UROBILINOGEN UA: 0.2 U/dL
pH, UA: 6 (ref 5.0–8.0)

## 2016-06-28 ENCOUNTER — Other Ambulatory Visit: Payer: Self-pay | Admitting: Family Medicine

## 2016-06-28 DIAGNOSIS — E785 Hyperlipidemia, unspecified: Secondary | ICD-10-CM

## 2016-09-27 ENCOUNTER — Other Ambulatory Visit: Payer: Self-pay | Admitting: Family Medicine

## 2016-11-07 ENCOUNTER — Other Ambulatory Visit: Payer: Self-pay | Admitting: Family Medicine

## 2017-01-21 ENCOUNTER — Other Ambulatory Visit: Payer: Self-pay | Admitting: Family Medicine

## 2017-07-11 ENCOUNTER — Other Ambulatory Visit: Payer: Self-pay | Admitting: Family Medicine

## 2017-07-11 DIAGNOSIS — E785 Hyperlipidemia, unspecified: Secondary | ICD-10-CM

## 2017-07-12 NOTE — Telephone Encounter (Signed)
Pharmacy requesting refills. Thanks!  

## 2017-07-28 ENCOUNTER — Encounter: Payer: Self-pay | Admitting: Family Medicine

## 2017-07-28 ENCOUNTER — Ambulatory Visit (INDEPENDENT_AMBULATORY_CARE_PROVIDER_SITE_OTHER): Payer: BLUE CROSS/BLUE SHIELD | Admitting: Family Medicine

## 2017-07-28 VITALS — BP 128/76 | HR 78 | Temp 98.5°F | Resp 16 | Wt 237.0 lb

## 2017-07-28 DIAGNOSIS — E7849 Other hyperlipidemia: Secondary | ICD-10-CM | POA: Diagnosis not present

## 2017-07-28 DIAGNOSIS — Z0000 Encounter for general adult medical examination without abnormal findings: Secondary | ICD-10-CM

## 2017-07-28 DIAGNOSIS — Z125 Encounter for screening for malignant neoplasm of prostate: Secondary | ICD-10-CM

## 2017-07-28 DIAGNOSIS — K4090 Unilateral inguinal hernia, without obstruction or gangrene, not specified as recurrent: Secondary | ICD-10-CM | POA: Diagnosis not present

## 2017-07-28 LAB — POCT URINALYSIS DIPSTICK
BILIRUBIN UA: NEGATIVE
GLUCOSE UA: NEGATIVE
KETONES UA: NEGATIVE
Leukocytes, UA: NEGATIVE
NITRITE UA: NEGATIVE
PH UA: 6 (ref 5.0–8.0)
Protein, UA: NEGATIVE
RBC UA: NEGATIVE
SPEC GRAV UA: 1.01 (ref 1.010–1.025)
UROBILINOGEN UA: 0.2 U/dL

## 2017-07-28 NOTE — Progress Notes (Signed)
Patient: Frank Hester, Male    DOB: 11/02/1955, 62 y.o.   MRN: [id number] Visit Date: 07/28/2017  Today's Provider: Megan Mans, MD   Chief Complaint  Patient presents with  . Annual Exam   Subjective:    Annual physical exam Frank Hester is a 62 y.o. male who presents today for health maintenance and complete physical. He feels well. He reports exercising not regularly, but he does stay active. He reports he is sleeping well.  Colonoscopy- 01/17/2015. 1 polyp removed. Hyperplastic poylp.  Tdap- 01/15/2009.    Review of Systems  Constitutional: Negative.   HENT: Negative.   Eyes: Negative.   Respiratory: Negative.   Cardiovascular: Negative.   Gastrointestinal: Negative.   Endocrine: Negative.   Genitourinary: Negative.   Musculoskeletal: Negative.   Skin: Negative.   Allergic/Immunologic: Negative.   Neurological: Negative.   Hematological: Negative.   Psychiatric/Behavioral: Negative.     Social History      He  reports that he quit smoking about 24 years ago. His smoking use included cigars and cigarettes. He smoked 0.25 packs per day. He quit smokeless tobacco use about 24 years ago. He reports that he does not drink alcohol or use drugs.       Social History   Socioeconomic History  . Marital status: Married    Spouse name: Not on file  . Number of children: Not on file  . Years of education: Not on file  . Highest education level: Not on file  Occupational History  . Not on file  Social Needs  . Financial resource strain: Not on file  . Food insecurity:    Worry: Not on file    Inability: Not on file  . Transportation needs:    Medical: Not on file    Non-medical: Not on file  Tobacco Use  . Smoking status: Former Smoker    Packs/day: 0.25    Types: Cigars, Cigarettes    Last attempt to quit: 01/04/1993    Years since quitting: 24.5  . Smokeless tobacco: Former Neurosurgeon    Quit date: 01/04/1993  . Tobacco comment: Used chewing  tobacco, smoked cigarettes, smoked for about 20 years. Quit smoking in the 1990s  Substance and Sexual Activity  . Alcohol use: No  . Drug use: No  . Sexual activity: Not on file  Lifestyle  . Physical activity:    Days per week: Not on file    Minutes per session: Not on file  . Stress: Not on file  Relationships  . Social connections:    Talks on phone: Not on file    Gets together: Not on file    Attends religious service: Not on file    Active member of club or organization: Not on file    Attends meetings of clubs or organizations: Not on file    Relationship status: Not on file  Other Topics Concern  . Not on file  Social History Narrative  . Not on file    Past Medical History:  Diagnosis Date  . Hyperlipidemia   . Hypertension   . Sleep apnea      Patient Active Problem List   Diagnosis Date Noted  . Arteriosclerosis of coronary artery 10/22/2014  . Herpes simplex 10/22/2014  . Failure of erection 10/22/2014  . HLD (hyperlipidemia) 10/22/2014  . BP (high blood pressure) 10/22/2014  . Adiposity 10/22/2014  . Obstructive apnea 10/22/2014    Past Surgical History:  Procedure Laterality Date  . COLONOSCOPY WITH PROPOFOL N/A 01/17/2015   Procedure: COLONOSCOPY WITH PROPOFOL;  Surgeon: Scot Junobert T Elliott, MD;  [facility];  Service: Endoscopy;  Laterality: N/A;  . Status post coranary artery stent placement  03/21/2008  . Status post kneesurgery Left 1988    Family History        Family Status  Relation Name Status  . Mother  Alive  . Father  Deceased at age 60       died from an MI  . Brother  Alive  . Oneal Groutat Uncle  Alive       coronary disease  . MGF  Deceased at age 60       died of stomach cancer  . Brother  Alive        His family history includes Cancer in his maternal grandfather; Heart disease in his father and paternal uncle.      No Known Allergies   Current Outpatient Medications:  .  acyclovir (ZOVIRAX) 400 MG tablet, TAKE 1  TABLET (400 MG TOTAL) BY MOUTH 3 (THREE) TIMES DAILY., Disp: 21 tablet, Rfl: 5 .  aspirin 81 MG tablet, Take by mouth., Disp: , Rfl:  .  clopidogrel (PLAVIX) 75 MG tablet, TAKE 1 TABLET BY MOUTH EVERY DAY, Disp: 30 tablet, Rfl: 11 .  NON FORMULARY, CPAP (device) for sleep apnea, Disp: , Rfl:  .  rosuvastatin (CRESTOR) 20 MG tablet, TAKE 1 TABLET BY MOUTH EVERY DAY AT BEDTIME, Disp: 90 tablet, Rfl: 0 .  sildenafil (REVATIO) 20 MG tablet, Take 1 tablet (20 mg total) by mouth 3 (three) times daily., Disp: 50 tablet, Rfl: 12   Patient Care Team: Maple HudsonGilbert, Onur Mori L Jr., MD as PCP - General (Family Medicine)      Objective:   Vitals: BP 128/76 (BP Location: Right Arm, Patient Position: Sitting, Cuff Size: Large)   Pulse 78   Temp 98.5 F (36.9 C)   Resp 16   Wt 237 lb (107.5 kg)   SpO2 98%   BMI 30.43 kg/m    Vitals:   07/28/17 1438  BP: 128/76  Pulse: 78  Resp: 16  Temp: 98.5 F (36.9 C)  SpO2: 98%  Weight: 237 lb (107.5 kg)     Physical Exam  Constitutional: He is oriented to person, place, and time. He appears well-developed and well-nourished.  HENT:  Head: Normocephalic and atraumatic.  Right Ear: External ear normal.  Left Ear: External ear normal.  Nose: Nose normal.  Mouth/Throat: Oropharynx is clear and moist.  Eyes: Conjunctivae are normal. No scleral icterus.  Neck: No thyromegaly present.  Cardiovascular: Normal rate, regular rhythm, normal heart sounds and intact distal pulses.  Pulmonary/Chest: Effort normal and breath sounds normal.  Abdominal: Soft.  Genitourinary: Penis normal.  Genitourinary Comments: Inguinal hernia noted on exam.  Musculoskeletal: He exhibits no edema.  Lymphadenopathy:    He has no cervical adenopathy.  Neurological: He is alert and oriented to person, place, and time.  Skin: Skin is warm and dry.  Psychiatric: He has a normal mood and affect. His behavior is normal. Judgment and thought content normal.     Depression  Screen PHQ 2/9 Scores 07/28/2017 06/02/2016  PHQ - 2 Score 0 0  PHQ- 9 Score - 1      Assessment & Plan:     Routine Health Maintenance and Physical Exam  Exercise Activities and Dietary recommendations Goals    None      Immunization History  Administered Date(s) Administered  . Influenza,inj,Quad PF,6+ Mos 10/22/2014  . Pneumococcal Polysaccharide-23 09/18/2009  . Tdap 01/15/2009    Health Maintenance  Topic Date Due  . HIV Screening  06/06/1972  . INFLUENZA VACCINE  08/04/2017  . TETANUS/TDAP  01/16/2019  . COLONOSCOPY  01/16/2025  . Hepatitis C Screening  Completed     Discussed health benefits of physical activity, and encouraged him to engage in regular exercise appropriate for his age and condition.  CAD All risk factors treated. Inguinal hernia Refer to surgery to discuss.    I have done the exam and reviewed the above chart and it is accurate to the best of my knowledge. Dentist has been used in this note in any air is in the dictation or transcription are unintentional.  Megan Mans, MD  [facility]

## 2017-07-30 LAB — LIPID PANEL
Chol/HDL Ratio: 3.6 ratio (ref 0.0–5.0)
Cholesterol, Total: 151 mg/dL (ref 100–199)
HDL: 42 mg/dL (ref >39)
LDL CALC: 90 mg/dL (ref 0–99)
Triglycerides: 97 mg/dL (ref 0–149)
VLDL CHOLESTEROL CAL: 19 mg/dL (ref 5–40)

## 2017-07-30 LAB — CBC WITH DIFFERENTIAL/PLATELET
Basophils Absolute: 0 10*3/uL (ref 0.0–0.2)
Basos: 0 %
EOS (ABSOLUTE): 0.2 10*3/uL (ref 0.0–0.4)
EOS: 3 %
HEMATOCRIT: 41.9 % (ref 37.5–51.0)
HEMOGLOBIN: 14.7 g/dL (ref 13.0–17.7)
IMMATURE GRANS (ABS): 0 10*3/uL (ref 0.0–0.1)
Immature Granulocytes: 0 %
LYMPHS ABS: 1.6 10*3/uL (ref 0.7–3.1)
Lymphs: 30 %
MCH: 32.2 pg (ref 26.6–33.0)
MCHC: 35.1 g/dL (ref 31.5–35.7)
MCV: 92 fL (ref 79–97)
MONOCYTES: 11 %
Monocytes Absolute: 0.6 10*3/uL (ref 0.1–0.9)
NEUTROS PCT: 56 %
Neutrophils Absolute: 3 10*3/uL (ref 1.4–7.0)
Platelets: 209 10*3/uL (ref 150–450)
RBC: 4.56 x10E6/uL (ref 4.14–5.80)
RDW: 13.4 % (ref 12.3–15.4)
WBC: 5.4 10*3/uL (ref 3.4–10.8)

## 2017-07-30 LAB — COMPREHENSIVE METABOLIC PANEL
ALT: 16 IU/L (ref 0–44)
AST: 19 IU/L (ref 0–40)
Albumin/Globulin Ratio: 1.8 (ref 1.2–2.2)
Albumin: 4.2 g/dL (ref 3.6–4.8)
Alkaline Phosphatase: 40 IU/L (ref 39–117)
BUN / CREAT RATIO: 14 (ref 10–24)
BUN: 16 mg/dL (ref 8–27)
Bilirubin Total: 0.5 mg/dL (ref 0.0–1.2)
CALCIUM: 9.2 mg/dL (ref 8.6–10.2)
CHLORIDE: 105 mmol/L (ref 96–106)
CO2: 23 mmol/L (ref 20–29)
CREATININE: 1.16 mg/dL (ref 0.76–1.27)
GFR calc Af Amer: 79 mL/min/{1.73_m2} (ref >59)
GFR calc non Af Amer: 68 mL/min/{1.73_m2} (ref >59)
Globulin, Total: 2.3 g/dL (ref 1.5–4.5)
Glucose: 87 mg/dL (ref 65–99)
Potassium: 4.7 mmol/L (ref 3.5–5.2)
Sodium: 140 mmol/L (ref 134–144)
TOTAL PROTEIN: 6.5 g/dL (ref 6.0–8.5)

## 2017-07-30 LAB — TSH: TSH: 1.92 u[IU]/mL (ref 0.450–4.500)

## 2017-08-23 LAB — PSA: Prostate Specific Ag, Serum: 0.6 ng/mL (ref 0.0–4.0)

## 2017-08-23 LAB — SPECIMEN STATUS REPORT

## 2017-08-24 ENCOUNTER — Telehealth: Payer: Self-pay

## 2017-08-24 NOTE — Telephone Encounter (Signed)
-----   Message from Maple Hudsonichard L Gilbert Jr., MD sent at 08/23/2017  2:25 PM EDT ----- PSA good.

## 2017-08-24 NOTE — Telephone Encounter (Signed)
Tried calling patient and no answer. Will try again later.  

## 2017-08-26 NOTE — Telephone Encounter (Signed)
Pt advised.   Thanks,   -Elishia Kaczorowski  

## 2017-08-30 ENCOUNTER — Encounter: Payer: Self-pay | Admitting: General Surgery

## 2017-10-15 ENCOUNTER — Other Ambulatory Visit: Payer: Self-pay | Admitting: Family Medicine

## 2017-10-15 DIAGNOSIS — E785 Hyperlipidemia, unspecified: Secondary | ICD-10-CM

## 2017-10-18 ENCOUNTER — Other Ambulatory Visit: Payer: Self-pay | Admitting: Family Medicine

## 2018-02-06 ENCOUNTER — Telehealth: Payer: Self-pay | Admitting: Family Medicine

## 2018-02-06 DIAGNOSIS — I2510 Atherosclerotic heart disease of native coronary artery without angina pectoris: Secondary | ICD-10-CM

## 2018-02-06 DIAGNOSIS — K4090 Unilateral inguinal hernia, without obstruction or gangrene, not specified as recurrent: Secondary | ICD-10-CM

## 2018-02-06 NOTE — Telephone Encounter (Signed)
Pt needing a refill on: clopidogrel (PLAVIX) 75 MG tablet  Please fill at:    [id number], [place] - [address] [phone] (Phone) [phone] (Fax)    Insurance will cover not cover anyone in [place] area for his hernia surgery. The locations in [facility]: [facility]  [facility] - Dr Thomes CakeArielle  [facility] - Dr Remigio EisenmengerWayne Overby  Needing referrals to these providers above.  Thanks, [employer] [facility] [employer]

## 2018-02-06 NOTE — Telephone Encounter (Signed)
Please review

## 2018-02-15 ENCOUNTER — Other Ambulatory Visit: Payer: Self-pay | Admitting: Family Medicine

## 2018-02-15 NOTE — Telephone Encounter (Signed)
Pt called back wanting to know if a referral has been set up for him for the hernia surgery  He cannot see anyone in [place].  He left a couple of doctors names that he can see.  CB#  [phone]  Thanks Barth Kirks

## 2018-02-15 NOTE — Telephone Encounter (Signed)
Please review. Thanks!  

## 2018-02-16 MED ORDER — CLOPIDOGREL BISULFATE 75 MG PO TABS: 75.0000 mg | ORAL_TABLET | Freq: Every day | ORAL | 3 refills | Status: DC

## 2018-02-16 NOTE — Telephone Encounter (Signed)
Order for referral was placed. Medication was sent into the pharmacy.

## 2018-02-16 NOTE — Telephone Encounter (Signed)
Either Dr. is okay.  I do not know either 1 of them.  He can also have the refill on Plavix.  Thank you.

## 2018-02-16 NOTE — Telephone Encounter (Signed)
Pharmacy requesting refills. Thanks!  

## 2018-06-13 ENCOUNTER — Other Ambulatory Visit: Payer: Self-pay | Admitting: Family Medicine

## 2018-06-13 DIAGNOSIS — E785 Hyperlipidemia, unspecified: Secondary | ICD-10-CM

## 2018-07-31 ENCOUNTER — Ambulatory Visit (INDEPENDENT_AMBULATORY_CARE_PROVIDER_SITE_OTHER): Payer: BLUE CROSS/BLUE SHIELD | Admitting: Family Medicine

## 2018-07-31 ENCOUNTER — Other Ambulatory Visit: Payer: Self-pay

## 2018-07-31 ENCOUNTER — Encounter: Payer: Self-pay | Admitting: Family Medicine

## 2018-07-31 VITALS — BP 110/62 | HR 75 | Temp 97.8°F | Resp 18 | Wt 253.6 lb

## 2018-07-31 DIAGNOSIS — E7800 Pure hypercholesterolemia, unspecified: Secondary | ICD-10-CM

## 2018-07-31 DIAGNOSIS — I10 Essential (primary) hypertension: Secondary | ICD-10-CM

## 2018-07-31 DIAGNOSIS — Z0000 Encounter for general adult medical examination without abnormal findings: Secondary | ICD-10-CM | POA: Diagnosis not present

## 2018-07-31 DIAGNOSIS — R5383 Other fatigue: Secondary | ICD-10-CM | POA: Diagnosis not present

## 2018-07-31 DIAGNOSIS — M791 Myalgia, unspecified site: Secondary | ICD-10-CM

## 2018-07-31 DIAGNOSIS — H6121 Impacted cerumen, right ear: Secondary | ICD-10-CM

## 2018-07-31 DIAGNOSIS — I2510 Atherosclerotic heart disease of native coronary artery without angina pectoris: Secondary | ICD-10-CM

## 2018-07-31 LAB — POCT URINALYSIS DIPSTICK
Bilirubin, UA: NEGATIVE
Blood, UA: NEGATIVE
Glucose, UA: NEGATIVE
Ketones, UA: NEGATIVE
Leukocytes, UA: NEGATIVE
Nitrite, UA: NEGATIVE
Protein, UA: NEGATIVE
Spec Grav, UA: 1.015 (ref 1.010–1.025)
Urobilinogen, UA: 0.2 E.U./dL
pH, UA: 6 (ref 5.0–8.0)

## 2018-07-31 NOTE — Progress Notes (Signed)
Patient: Frank Hester, Male    DOB: 03/04/1955, 63 y.o.   MRN: [id number] Visit Date: 07/31/2018  Today's Provider: Megan Mansichard  Jr, MD   Chief Complaint  Patient presents with  . Annual Exam   Subjective:     Annual physical exam Frank LanStanley E Tweten is a 63 y.o. male who presents today for health maintenance and complete physical. He feels fairly well. Patient has been more tired over the last year. He reports not regularly exercising . He reports he is sleeping well, but not using CPAP machine. Patient needs referral for colonoscopy. In general pt says he definitely thinks he feels more fatigue when he is on a statin. He also feels some more fatigue as he is aging--nothing that is dramatic. He asks about testosterone level. -----------------------------------------------------------------   Review of Systems  Constitutional: Positive for fatigue.  HENT: Negative.   Eyes: Negative.   Respiratory: Negative.   Cardiovascular: Negative.   Gastrointestinal: Negative.   Endocrine: Negative.   Genitourinary: Negative.   Musculoskeletal: Negative.   Skin: Negative.   Allergic/Immunologic: Negative.   Hematological: Negative.   Psychiatric/Behavioral: Negative.     Social History      He  reports that he quit smoking about 25 years ago. His smoking use included cigars and cigarettes. He smoked 0.25 packs per day. He quit smokeless tobacco use about 25 years ago. He reports that he does not drink alcohol or use drugs.       Social History   Socioeconomic History  . Marital status: Married    Spouse name: Not on file  . Number of children: Not on file  . Years of education: Not on file  . Highest education level: Not on file  Occupational History  . Not on file  Social Needs  . Financial resource strain: Not on file  . Food insecurity    Worry: Not on file    Inability: Not on file  . Transportation needs    Medical: Not on file    Non-medical: Not on  file  Tobacco Use  . Smoking status: Former Smoker    Packs/day: 0.25    Types: Cigars, Cigarettes    Quit date: 01/04/1993    Years since quitting: 25.5  . Smokeless tobacco: Former NeurosurgeonUser    Quit date: 01/04/1993  . Tobacco comment: Used chewing tobacco, smoked cigarettes, smoked for about 20 years. Quit smoking in the 1990s  Substance and Sexual Activity  . Alcohol use: No  . Drug use: No  . Sexual activity: Not on file  Lifestyle  . Physical activity    Days per week: Not on file    Minutes per session: Not on file  . Stress: Not on file  Relationships  . Social Musicianconnections    Talks on phone: Not on file    Gets together: Not on file    Attends religious service: Not on file    Active member of club or organization: Not on file    Attends meetings of clubs or organizations: Not on file    Relationship status: Not on file  Other Topics Concern  . Not on file  Social History Narrative  . Not on file    Past Medical History:  Diagnosis Date  . Hyperlipidemia   . Hypertension   . Sleep apnea      Patient Active Problem List   Diagnosis Date Noted  . Arteriosclerosis of coronary artery 10/22/2014  .  Herpes simplex 10/22/2014  . Failure of erection 10/22/2014  . HLD (hyperlipidemia) 10/22/2014  . BP (high blood pressure) 10/22/2014  . Adiposity 10/22/2014  . Obstructive apnea 10/22/2014    Past Surgical History:  Procedure Laterality Date  . COLONOSCOPY WITH PROPOFOL N/A 01/17/2015   Procedure: COLONOSCOPY WITH PROPOFOL;  Surgeon: Scot Junobert T Elliott, MD;  [facility];  Service: Endoscopy;  Laterality: N/A;  . Status post coranary artery stent placement  03/21/2008  . Status post kneesurgery Left 1988    Family History        Family Status  Relation Name Status  . Mother  Alive  . Father  Deceased at age 61       died from an MI  . Brother  Alive  . Oneal Groutat Uncle  Alive       coronary disease  . MGF  Deceased at age 61       died of stomach cancer   . Brother  Alive        His family history includes Cancer in his maternal grandfather; Heart disease in his father and paternal uncle.      No Known Allergies   Current Outpatient Medications:  .  acyclovir (ZOVIRAX) 400 MG tablet, TAKE 1 TABLET (400 MG TOTAL) BY MOUTH 3 (THREE) TIMES DAILY., Disp: 21 tablet, Rfl: 5 .  aspirin 81 MG tablet, Take by mouth., Disp: , Rfl:  .  clopidogrel (PLAVIX) 75 MG tablet, Take 1 tablet (75 mg total) by mouth daily., Disp: 90 tablet, Rfl: 3 .  rosuvastatin (CRESTOR) 20 MG tablet, TAKE 1 TABLET BY MOUTH EVERYDAY AT BEDTIME, Disp: 90 tablet, Rfl: 3 .  sildenafil (REVATIO) 20 MG tablet, TAKE 1 TABLET BY MOUTH THREE TIMES A DAY, Disp: 50 tablet, Rfl: 12 .  NON FORMULARY, CPAP (device) for sleep apnea, Disp: , Rfl:    Patient Care Team: Maple HudsonGilbert,  L Jr., MD as PCP - General (Family Medicine)    Objective:    Vitals: BP 110/62 (BP Location: Right Arm, Patient Position: Sitting, Cuff Size: Large)   Pulse 75   Temp 97.8 F (36.6 C) (Oral)   Resp 18   Wt 253 lb 9.6 oz (115 kg)   SpO2 97%   BMI 32.56 kg/m    Vitals:   07/31/18 1410  BP: 110/62  Pulse: 75  Resp: 18  Temp: 97.8 F (36.6 C)  TempSrc: Oral  SpO2: 97%  Weight: 253 lb 9.6 oz (115 kg)     Physical Exam Constitutional:      Appearance: He is well-developed.  HENT:     Head: Normocephalic and atraumatic.     Right Ear: External ear normal.     Left Ear: External ear normal.     Nose: Nose normal.  Eyes:     General: No scleral icterus.    Conjunctiva/sclera: Conjunctivae normal.  Neck:     Thyroid: No thyromegaly.  Cardiovascular:     Rate and Rhythm: Normal rate and regular rhythm.     Heart sounds: Normal heart sounds.  Pulmonary:     Effort: Pulmonary effort is normal.     Breath sounds: Normal breath sounds.  Abdominal:     Palpations: Abdomen is soft.  Genitourinary:    Penis: Normal.      Scrotum/Testes: Normal.  Lymphadenopathy:     Cervical: No  cervical adenopathy.  Skin:    General: Skin is warm and dry.  Neurological:     Mental Status:  He is alert and oriented to person, place, and time.  Psychiatric:        Behavior: Behavior normal.        Thought Content: Thought content normal.        Judgment: Judgment normal.      Depression Screen PHQ 2/9 Scores 07/31/2018 07/28/2017 06/02/2016  PHQ - 2 Score 0 0 0  PHQ- 9 Score 0 - 1       Assessment & Plan:     Routine Health Maintenance and Physical Exam  Exercise Activities and Dietary recommendations Goals   None     Immunization History  Administered Date(s) Administered  . Influenza,inj,Quad PF,6+ Mos 10/22/2014  . Pneumococcal Polysaccharide-23 09/18/2009  . Tdap 01/15/2009    Health Maintenance  Topic Date Due  . HIV Screening  06/06/1972  . INFLUENZA VACCINE  08/05/2018  . TETANUS/TDAP  01/16/2019  . COLONOSCOPY  01/16/2025  . Hepatitis C Screening  Completed     Discussed health benefits of physical activity, and encouraged him to engage in regular exercise appropriate for his age and condition.   1. Annual physical exam  - POCT urinalysis dipstick - PSA - CBC w/Diff/Platelet - Lipid Profile - Comp. Metabolic Panel (12) - TSH  2. Myalgia Possibly secondary to statin--he will discuss options with cardiology.  3. Fatigue, unspecified type Multifactorial--would be reluctant to use Testosterone. - Testosterone  4. Impacted cerumen of right ear Cleared. - Ear Lavage  5. Arteriosclerosis of coronary artery Doing well. All risk factors treated.  6. Pure hypercholesterolemia Check labs--not on statin recently  7. Essential hypertension Controlled.  --------------------------------------------------------------------    Wilhemena Durie, MD  [facility]

## 2018-08-08 LAB — COMP. METABOLIC PANEL (12)
AST: 23 IU/L (ref 0–40)
Albumin/Globulin Ratio: 2 (ref 1.2–2.2)
Albumin: 4.3 g/dL (ref 3.8–4.8)
Alkaline Phosphatase: 41 IU/L (ref 39–117)
BUN/Creatinine Ratio: 18 (ref 10–24)
BUN: 20 mg/dL (ref 8–27)
Bilirubin Total: 0.4 mg/dL (ref 0.0–1.2)
Calcium: 8.9 mg/dL (ref 8.6–10.2)
Chloride: 104 mmol/L (ref 96–106)
Creatinine, Ser: 1.09 mg/dL (ref 0.76–1.27)
GFR calc Af Amer: 84 mL/min/{1.73_m2} (ref >59)
GFR calc non Af Amer: 73 mL/min/{1.73_m2} (ref >59)
Globulin, Total: 2.1 g/dL (ref 1.5–4.5)
Glucose: 94 mg/dL (ref 65–99)
Potassium: 4.4 mmol/L (ref 3.5–5.2)
Sodium: 139 mmol/L (ref 134–144)
Total Protein: 6.4 g/dL (ref 6.0–8.5)

## 2018-08-08 LAB — CBC WITH DIFFERENTIAL/PLATELET
Basophils Absolute: 0 10*3/uL (ref 0.0–0.2)
Basos: 0 %
EOS (ABSOLUTE): 0.1 10*3/uL (ref 0.0–0.4)
Eos: 2 %
Hematocrit: 39.7 % (ref 37.5–51.0)
Hemoglobin: 13.7 g/dL (ref 13.0–17.7)
Immature Grans (Abs): 0 10*3/uL (ref 0.0–0.1)
Immature Granulocytes: 1 %
Lymphocytes Absolute: 1.8 10*3/uL (ref 0.7–3.1)
Lymphs: 30 %
MCH: 30.4 pg (ref 26.6–33.0)
MCHC: 34.5 g/dL (ref 31.5–35.7)
MCV: 88 fL (ref 79–97)
Monocytes Absolute: 0.5 10*3/uL (ref 0.1–0.9)
Monocytes: 8 %
Neutrophils Absolute: 3.6 10*3/uL (ref 1.4–7.0)
Neutrophils: 59 %
Platelets: 239 10*3/uL (ref 150–450)
RBC: 4.5 x10E6/uL (ref 4.14–5.80)
RDW: 11.9 % (ref 11.6–15.4)
WBC: 6.1 10*3/uL (ref 3.4–10.8)

## 2018-08-08 LAB — PSA: Prostate Specific Ag, Serum: 0.7 ng/mL (ref 0.0–4.0)

## 2018-08-08 LAB — TESTOSTERONE: Testosterone: 450 ng/dL (ref 264–916)

## 2018-08-08 LAB — TSH: TSH: 1.6 u[IU]/mL (ref 0.450–4.500)

## 2018-08-08 LAB — LIPID PANEL
Chol/HDL Ratio: 4.9 ratio (ref 0.0–5.0)
Cholesterol, Total: 187 mg/dL (ref 100–199)
HDL: 38 mg/dL — ABNORMAL LOW (ref >39)
LDL Calculated: 118 mg/dL — ABNORMAL HIGH (ref 0–99)
Triglycerides: 155 mg/dL — ABNORMAL HIGH (ref 0–149)
VLDL Cholesterol Cal: 31 mg/dL (ref 5–40)

## 2018-08-09 ENCOUNTER — Telehealth: Payer: Self-pay

## 2018-08-09 DIAGNOSIS — E7849 Other hyperlipidemia: Secondary | ICD-10-CM

## 2018-08-09 MED ORDER — EZETIMIBE 10 MG PO TABS: 10.0000 mg | ORAL_TABLET | Freq: Every day | ORAL | 1 refills | Status: DC

## 2018-08-09 NOTE — Telephone Encounter (Signed)
-----   Message from Jerrol Banana., MD sent at 08/08/2018  4:37 PM EDT ----- Labs ok but cholesterol too high with heart--consider restarting statin or discuss other options with cardiology.. At least do Zetia 10mg  q am which is not a statin.

## 2018-08-09 NOTE — Telephone Encounter (Signed)
Patient notified of lab results. Patient agreed with starting Zetia 10mg .

## 2018-10-30 NOTE — Progress Notes (Signed)
Patient: Frank Hester Male    DOB: 07/27/1955   63 y.o.   MRN: [id number] Visit Date: 10/31/2018  Today's Provider: Wilhemena Durie, MD   Chief Complaint  Patient presents with  . Hyperlipidemia   Subjective:    HPI  Pure hypercholesterolemia From 07/31/2018-Checked labs--started Zetia 10mg  q am.  Patient had myalgias with statin.  No side effects with Zetia.  We will see where his lipids are with Zetia. Lab Results  Component Value Date   CHOL 187 08/07/2018   HDL 38 (L) 08/07/2018   LDLCALC 118 (H) 08/07/2018   TRIG 155 (H) 08/07/2018   CHOLHDL 4.9 08/07/2018    No Known Allergies   Current Outpatient Medications:  .  acyclovir (ZOVIRAX) 400 MG tablet, TAKE 1 TABLET (400 MG TOTAL) BY MOUTH 3 (THREE) TIMES DAILY., Disp: 21 tablet, Rfl: 5 .  aspirin 81 MG tablet, Take by mouth., Disp: , Rfl:  .  clopidogrel (PLAVIX) 75 MG tablet, Take 1 tablet (75 mg total) by mouth daily., Disp: 90 tablet, Rfl: 3 .  ezetimibe (ZETIA) 10 MG tablet, Take 1 tablet (10 mg total) by mouth daily., Disp: 90 tablet, Rfl: 1 .  NON FORMULARY, CPAP (device) for sleep apnea, Disp: , Rfl:  .  sildenafil (REVATIO) 20 MG tablet, TAKE 1 TABLET BY MOUTH THREE TIMES A DAY, Disp: 50 tablet, Rfl: 12 .  rosuvastatin (CRESTOR) 20 MG tablet, TAKE 1 TABLET BY MOUTH EVERYDAY AT BEDTIME (Patient not taking: Reported on 10/31/2018), Disp: 90 tablet, Rfl: 3  Review of Systems  Constitutional: Negative for appetite change, chills and fever.  Eyes: Negative.   Respiratory: Negative for chest tightness, shortness of breath and wheezing.   Cardiovascular: Negative for chest pain and palpitations.  Gastrointestinal: Negative for abdominal pain, nausea and vomiting.  Endocrine: Negative.   Genitourinary: Negative.   Musculoskeletal: Negative.   Allergic/Immunologic: Negative.   Psychiatric/Behavioral: Negative.     Social History   Tobacco Use  . Smoking status: Former Smoker    Packs/day: 0.25     Types: Cigars, Cigarettes    Quit date: 01/04/1993    Years since quitting: 25.8  . Smokeless tobacco: Former Systems developer    Quit date: 01/04/1993  . Tobacco comment: Used chewing tobacco, smoked cigarettes, smoked for about 20 years. Quit smoking in the 1990s  Substance Use Topics  . Alcohol use: No      Objective:   BP 126/74   Pulse 72   Temp 97.7 F (36.5 C)   Resp 16   Ht 6\' 3"  (1.905 m)   Wt 254 lb (115.2 kg)   SpO2 98%   BMI 31.75 kg/m  Vitals:   10/31/18 1434  BP: 126/74  Pulse: 72  Resp: 16  Temp: 97.7 F (36.5 C)  SpO2: 98%  Weight: 254 lb (115.2 kg)  Height: 6\' 3"  (1.905 m)  Body mass index is 31.75 kg/m.   Physical Exam Vitals signs reviewed.  Constitutional:      Appearance: Normal appearance.  HENT:     Head: Normocephalic and atraumatic.     Right Ear: External ear normal.     Left Ear: External ear normal.  Eyes:     General: No scleral icterus. Cardiovascular:     Rate and Rhythm: Normal rate and regular rhythm.     Heart sounds: Normal heart sounds.  Pulmonary:     Breath sounds: Normal breath sounds.  Abdominal:     Palpations:  Abdomen is soft.  Skin:    General: Skin is warm and dry.  Neurological:     General: No focal deficit present.     Mental Status: He is alert and oriented to person, place, and time.  Psychiatric:        Mood and Affect: Mood normal.        Behavior: Behavior normal.        Thought Content: Thought content normal.        Judgment: Judgment normal.      No results found for any visits on 10/31/18.     Assessment & Plan    1. Other hyperlipidemia Hope to see LDL 70 or below.  Consider adding 1 a week or twice a week rosuvastatin - Lipid panel  2. Flu vaccine need  - Flu Vaccine QUAD 6+ mos PF IM (Fluarix Quad PF)  3. Arteriosclerosis of coronary artery Risk factors discussed with patient.     Montay Vanvoorhis Wendelyn Breslow, MD  [facility]

## 2018-10-31 ENCOUNTER — Ambulatory Visit (INDEPENDENT_AMBULATORY_CARE_PROVIDER_SITE_OTHER): Payer: BLUE CROSS/BLUE SHIELD | Admitting: Family Medicine

## 2018-10-31 ENCOUNTER — Other Ambulatory Visit: Payer: Self-pay

## 2018-10-31 ENCOUNTER — Encounter: Payer: Self-pay | Admitting: Family Medicine

## 2018-10-31 VITALS — BP 126/74 | HR 72 | Temp 97.7°F | Resp 16 | Ht 75.0 in | Wt 254.0 lb

## 2018-10-31 DIAGNOSIS — E7849 Other hyperlipidemia: Secondary | ICD-10-CM

## 2018-10-31 DIAGNOSIS — I2510 Atherosclerotic heart disease of native coronary artery without angina pectoris: Secondary | ICD-10-CM | POA: Diagnosis not present

## 2018-10-31 DIAGNOSIS — Z23 Encounter for immunization: Secondary | ICD-10-CM | POA: Diagnosis not present

## 2018-11-02 LAB — LIPID PANEL
Chol/HDL Ratio: 4.3 ratio (ref 0.0–5.0)
Cholesterol, Total: 171 mg/dL (ref 100–199)
HDL: 40 mg/dL (ref >39)
LDL Chol Calc (NIH): 114 mg/dL — ABNORMAL HIGH (ref 0–99)
Triglycerides: 93 mg/dL (ref 0–149)
VLDL Cholesterol Cal: 17 mg/dL (ref 5–40)

## 2018-11-07 NOTE — Progress Notes (Deleted)
Dr. Rosanna Randy can you clarify if I am to have the patient stop the rosuvastatin and start the atorvastatin or if you want him to take both?

## 2018-11-08 ENCOUNTER — Other Ambulatory Visit: Payer: Self-pay

## 2018-11-08 DIAGNOSIS — E7849 Other hyperlipidemia: Secondary | ICD-10-CM

## 2018-11-08 MED ORDER — ATORVASTATIN CALCIUM 20 MG PO TABS: 20.0000 mg | ORAL_TABLET | Freq: Every day | ORAL | 3 refills | Status: AC

## 2018-11-08 NOTE — Progress Notes (Signed)
Atorvastatin 20mg  has been sent in to the patients pharmacy.

## 2018-11-08 NOTE — Addendum Note (Signed)
Addended by: Judie Petit on: 11/08/2018 04:55 PM   Modules accepted: Orders

## 2018-11-16 ENCOUNTER — Other Ambulatory Visit: Payer: Self-pay

## 2018-11-16 NOTE — Progress Notes (Unsigned)
Spoke with patient and informed him of the medication changes and his lipid panel results. He gave verbal understanding,

## 2018-11-16 NOTE — Progress Notes (Signed)
Spoke with patient and informed him of the medication changes and his lipid panel results. He gave verbal understanding, 

## 2019-02-10 ENCOUNTER — Other Ambulatory Visit: Payer: Self-pay | Admitting: Family Medicine

## 2019-02-10 DIAGNOSIS — E7849 Other hyperlipidemia: Secondary | ICD-10-CM

## 2019-02-23 ENCOUNTER — Other Ambulatory Visit: Payer: Self-pay | Admitting: Family Medicine

## 2019-02-23 DIAGNOSIS — I2510 Atherosclerotic heart disease of native coronary artery without angina pectoris: Secondary | ICD-10-CM

## 2019-02-23 NOTE — Telephone Encounter (Signed)
Refill request for clopidogrel 75 mg. Prescription expired on 02/16/19.

## 2019-02-26 NOTE — Telephone Encounter (Signed)
Please advise 

## 2019-03-22 ENCOUNTER — Ambulatory Visit: Payer: Self-pay

## 2019-06-17 ENCOUNTER — Other Ambulatory Visit: Payer: Self-pay | Admitting: Family Medicine

## 2019-06-17 DIAGNOSIS — E785 Hyperlipidemia, unspecified: Secondary | ICD-10-CM

## 2019-08-07 ENCOUNTER — Encounter: Payer: Self-pay | Admitting: Family Medicine

## 2019-08-07 ENCOUNTER — Other Ambulatory Visit: Payer: Self-pay

## 2019-08-07 ENCOUNTER — Ambulatory Visit (INDEPENDENT_AMBULATORY_CARE_PROVIDER_SITE_OTHER): Payer: BLUE CROSS/BLUE SHIELD | Admitting: Family Medicine

## 2019-08-07 VITALS — BP 132/84 | HR 63 | Temp 97.4°F | Ht 75.0 in | Wt 263.4 lb

## 2019-08-07 DIAGNOSIS — I2510 Atherosclerotic heart disease of native coronary artery without angina pectoris: Secondary | ICD-10-CM | POA: Diagnosis not present

## 2019-08-07 DIAGNOSIS — Z125 Encounter for screening for malignant neoplasm of prostate: Secondary | ICD-10-CM | POA: Diagnosis not present

## 2019-08-07 DIAGNOSIS — Z0000 Encounter for general adult medical examination without abnormal findings: Secondary | ICD-10-CM | POA: Diagnosis not present

## 2019-08-07 DIAGNOSIS — Z1389 Encounter for screening for other disorder: Secondary | ICD-10-CM | POA: Diagnosis not present

## 2019-08-07 LAB — POCT URINALYSIS DIPSTICK
Bilirubin, UA: NEGATIVE
Blood, UA: NEGATIVE
Glucose, UA: NEGATIVE
Ketones, UA: NEGATIVE
Leukocytes, UA: NEGATIVE
Nitrite, UA: NEGATIVE
Protein, UA: NEGATIVE
Spec Grav, UA: 1.01 (ref 1.010–1.025)
Urobilinogen, UA: 0.2 E.U./dL
pH, UA: 6 (ref 5.0–8.0)

## 2019-08-07 NOTE — Progress Notes (Signed)
Inetta Fermo Cummings,acting as a scribe for Megan Mans, MD.,have documented all relevant documentation on the behalf of Megan Mans, MD,as directed by  Megan Mans, MD while in the presence of Megan Mans, MD.  Complete physical exam   Patient: Frank Hester   DOB: [birth year]   64 y.o. Male  MRN: [id number] Visit Date: 08/07/2019  Today's healthcare provider: Megan Mans, MD   Chief Complaint  Patient presents with  . Annual Exam   Subjective    Frank Hester is a 64 y.o. male who presents today for a complete physical exam.  He reports consuming a general diet. The patient has a physically strenuous job, but has no regular exercise apart from work.  He generally feels well. He reports sleeping fairly well. He does not have additional problems to discuss today.  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  He has 4 children, all of whom are married.  He has 4 grandchildren all by his first daughter HPI  No complaints.  Past Medical History:  Diagnosis Date  . Hyperlipidemia   . Hypertension   . Sleep apnea    Past Surgical History:  Procedure Laterality Date  . COLONOSCOPY WITH PROPOFOL N/A 01/17/2015   Procedure: COLONOSCOPY WITH PROPOFOL;  Surgeon: Scot Jun, MD;  Location: [facility] ENDOSCOPY;  Service: Endoscopy;  Laterality: N/A;  . Status post coranary artery stent placement  03/21/2008  . Status post kneesurgery Left 1988   Social History   Socioeconomic History  . Marital status: Married    Spouse name: Not on file  . Number of children: Not on file  . Years of education: Not on file  . Highest education level: Not on file  Occupational History  . Not on file  Tobacco Use  . Smoking status: Former Smoker    Packs/day: 0.25    Types: Cigars, Cigarettes    Quit date: 01/04/1993    Years since quitting: 26.6  . Smokeless tobacco: Former Neurosurgeon    Quit date: 01/04/1993  . Tobacco comment: Used  chewing tobacco, smoked cigarettes, smoked for about 20 years. Quit smoking in the 1990s  Vaping Use  . Vaping Use: Never used  Substance and Sexual Activity  . Alcohol use: No  . Drug use: No  . Sexual activity: Not on file  Other Topics Concern  . Not on file  Social History Narrative  . Not on file   Social Determinants of Health   Financial Resource Strain:   . Difficulty of Paying Living Expenses:   Food Insecurity:   . Worried About Programme researcher, broadcasting/film/video in the Last Year:   . Barista in the Last Year:   Transportation Needs:   . Freight forwarder (Medical):   Marland Kitchen Lack of Transportation (Non-Medical):   Physical Activity:   . Days of Exercise per Week:   . Minutes of Exercise per Session:   Stress:   . Feeling of Stress :   Social Connections:   . Frequency of Communication with Friends and Family:   . Frequency of Social Gatherings with Friends and Family:   . Attends Religious Services:   . Active Member of Clubs or Organizations:   . Attends Banker Meetings:   Marland Kitchen Marital Status:   Intimate Partner Violence:   . Fear of Current or Ex-Partner:   . Emotionally Abused:   Marland Kitchen Physically Abused:   .  Sexually Abused:    Family Status  Relation Name Status  . Mother  Alive  . Father  Deceased at age 48       died from an MI  . Brother  Alive  . Oneal Grout  Alive       coronary disease  . MGF  Deceased at age 85       died of stomach cancer  . Brother  Alive   Family History  Problem Relation Age of Onset  . Heart disease Father        died from MI  . Heart disease Paternal Uncle   . Cancer Maternal Grandfather        stomach   No Known Allergies  Patient Care Team: Maple Hudson., MD as PCP - General (Family Medicine)   Medications: Outpatient Medications Prior to Visit  Medication Sig  . acyclovir (ZOVIRAX) 400 MG tablet TAKE 1 TABLET (400 MG TOTAL) BY MOUTH 3 (THREE) TIMES DAILY.  Marland Kitchen aspirin 81 MG tablet Take by mouth.   Marland Kitchen atorvastatin (LIPITOR) 20 MG tablet Take 1 tablet (20 mg total) by mouth daily.  . clopidogrel (PLAVIX) 75 MG tablet TAKE 1 TABLET BY MOUTH EVERY DAY  . ezetimibe (ZETIA) 10 MG tablet TAKE 1 TABLET BY MOUTH EVERY DAY  . NON FORMULARY CPAP (device) for sleep apnea  . oxyCODONE (OXY IR/ROXICODONE) 5 MG immediate release tablet TAKE 1 TABLET (5 MG TOTAL) BY MOUTH EVERY EIGHT (8) HOURS AS NEEDED FOR PAIN FOR UP TO 5 DAYS.  Marland Kitchen sildenafil (REVATIO) 20 MG tablet TAKE 1 TABLET BY MOUTH THREE TIMES A DAY  . sildenafil (VIAGRA) 100 MG tablet Take by mouth.   No facility-administered medications prior to visit.    Review of Systems  Constitutional: Negative.   HENT: Positive for ear discharge and mouth sores.   Eyes: Negative.   Respiratory: Negative.   Cardiovascular: Negative.   Gastrointestinal: Negative.   Endocrine: Negative.   Genitourinary: Negative.   Musculoskeletal: Negative.   Skin: Negative.   Allergic/Immunologic: Negative.   Neurological: Negative.   Hematological: Negative.   Psychiatric/Behavioral: Negative.     Last lipids Lab Results  Component Value Date   CHOL 171 11/01/2018   HDL 40 11/01/2018   LDLCALC 114 (H) 11/01/2018   TRIG 93 11/01/2018   CHOLHDL 4.3 11/01/2018      Objective    BP 132/84 (BP Location: Right Arm, Patient Position: Sitting, Cuff Size: Large)   Pulse 63   Temp (!) 97.4 F (36.3 C) (Oral)   Ht 6\' 3"  (1.905 m)   Wt 263 lb 6.4 oz (119.5 kg)   BMI 32.92 kg/m  BP Readings from Last 3 Encounters:  08/07/19 132/84  10/31/18 126/74  07/31/18 110/62   Wt Readings from Last 3 Encounters:  08/07/19 263 lb 6.4 oz (119.5 kg)  10/31/18 254 lb (115.2 kg)  07/31/18 253 lb 9.6 oz (115 kg)      Physical Exam Constitutional:      Appearance: Normal appearance. He is normal weight.  HENT:     Head: Normocephalic and atraumatic.     Right Ear: Tympanic membrane, ear canal and external ear normal.     Left Ear: Tympanic membrane, ear canal  and external ear normal.     Nose: Nose normal.     Mouth/Throat:     Mouth: Mucous membranes are moist.     Pharynx: Oropharynx is clear.  Eyes:     Extraocular  Movements: Extraocular movements intact.     Conjunctiva/sclera: Conjunctivae normal.     Pupils: Pupils are equal, round, and reactive to light.  Cardiovascular:     Rate and Rhythm: Normal rate and regular rhythm.     Pulses: Normal pulses.     Heart sounds: Normal heart sounds.  Pulmonary:     Effort: Pulmonary effort is normal.     Breath sounds: Normal breath sounds.  Abdominal:     General: Abdomen is flat. Bowel sounds are normal.     Palpations: Abdomen is soft.  Genitourinary:    Penis: Normal.      Testes: Normal.     Prostate: Normal.     Rectum: Normal. Guaiac result negative.  Musculoskeletal:        General: Normal range of motion.     Cervical back: Normal range of motion and neck supple.  Skin:    General: Skin is warm and dry.  Neurological:     General: No focal deficit present.     Mental Status: He is alert and oriented to person, place, and time.  Psychiatric:        Mood and Affect: Mood normal.        Behavior: Behavior normal.        Thought Content: Thought content normal.        Judgment: Judgment normal.       Last depression screening scores PHQ 2/9 Scores 07/31/2018 07/28/2017 06/02/2016  PHQ - 2 Score 0 0 0  PHQ- 9 Score 0 - 1   Last fall risk screening Fall Risk  07/31/2018  Falls in the past year? 0  Number falls in past yr: 0  Injury with Fall? 0   Last Audit-C alcohol use screening Alcohol Use Disorder Test (AUDIT) 07/31/2018  1. How often do you have a drink containing alcohol? 0  2. How many drinks containing alcohol do you have on a typical day when you are drinking? 0  3. How often do you have six or more drinks on one occasion? 0  AUDIT-C Score 0  Alcohol Brief Interventions/Follow-up -   A score of 3 or more in women, and 4 or more in men indicates increased risk  for alcohol abuse, EXCEPT if all of the points are from question 1   No results found for any visits on 08/07/19.  Assessment & Plan    Routine Health Maintenance and Physical Exam  Exercise Activities and Dietary recommendations Goals   None     Immunization History  Administered Date(s) Administered  . Influenza,inj,Quad PF,6+ Mos 10/22/2014, 10/31/2018  . Pneumococcal Polysaccharide-23 09/18/2009  . Tdap 01/15/2009    Health Maintenance  Topic Date Due  . COVID-19 Vaccine (1) Never done  . HIV Screening  Never done  . TETANUS/TDAP  01/16/2019  . INFLUENZA VACCINE  08/05/2019  . COLONOSCOPY  01/16/2025  . Hepatitis C Screening  Completed    Discussed health benefits of physical activity, and encouraged him to engage in regular exercise appropriate for his age and condition. 1. Annual physical exam Colonoscopy 2027 after hyperplastic polyp in 2017 . - CBC with Differential/Platelet - Comprehensive metabolic panel - Lipid Panel With LDL/HDL Ratio - TSH  2. Prostate cancer screening  - PSA  3. Screening for blood or protein in urine  - POCT urinalysis dipstick 4.  CAD All risk factors treated.  Patient on Zetia daily and Crestor twice a week.  Had pain with daily Lipitor.  No follow-ups on file.        Jameeka Marcy Wendelyn Breslow, MD  [facility] [phone] (phone) [phone] (fax)  [facility]

## 2019-08-12 ENCOUNTER — Other Ambulatory Visit: Payer: Self-pay | Admitting: Family Medicine

## 2019-08-12 NOTE — Telephone Encounter (Signed)
Requested Prescriptions  Pending Prescriptions Disp Refills  . sildenafil (REVATIO) 20 MG tablet [Pharmacy Med Name: SILDENAFIL 20 MG TABLET] 50 tablet 12    Sig: TAKE 1 TABLET BY MOUTH THREE TIMES A DAY     Urology: Erectile Dysfunction Agents Passed - 08/12/2019  1:29 PM      Passed - Last BP in normal range    BP Readings from Last 1 Encounters:  08/07/19 132/84         Passed - Valid encounter within last 12 months    Recent Outpatient Visits          5 days ago Annual physical exam   [facility] Maple Hudson., MD   9 months ago Other hyperlipidemia   [facility] Maple Hudson., MD   1 year ago Annual physical exam   [facility] Maple Hudson., MD   2 years ago Annual physical exam   [facility] Maple Hudson., MD   3 years ago Annual physical exam   [facility] Maple Hudson., MD      Future Appointments            In 12 months Maple Hudson., MD [facility]

## 2019-08-13 ENCOUNTER — Other Ambulatory Visit: Payer: Self-pay | Admitting: Family Medicine

## 2019-08-13 MED ORDER — ACYCLOVIR 400 MG PO TABS: 400.0000 mg | ORAL_TABLET | Freq: Three times a day (TID) | ORAL | 5 refills | Status: AC

## 2019-08-13 NOTE — Telephone Encounter (Signed)
[facility] faxed refill request for the following medications:  acyclovir (ZOVIRAX) 400 MG tablet    Please advise. 

## 2019-08-21 ENCOUNTER — Other Ambulatory Visit: Payer: Self-pay | Admitting: Family Medicine

## 2019-08-21 DIAGNOSIS — E7849 Other hyperlipidemia: Secondary | ICD-10-CM

## 2019-08-21 MED ORDER — EZETIMIBE 10 MG PO TABS: 10.0000 mg | ORAL_TABLET | Freq: Every day | ORAL | 1 refills | Status: DC

## 2019-08-21 NOTE — Telephone Encounter (Signed)
[facility] faxed refill request for the following medications: ? ?ezetimibe (ZETIA) 10 MG tablet  ? ?Please advise. ? ?

## 2019-08-25 LAB — CBC WITH DIFFERENTIAL/PLATELET
Basophils Absolute: 0 10*3/uL (ref 0.0–0.2)
Basos: 0 %
EOS (ABSOLUTE): 0.2 10*3/uL (ref 0.0–0.4)
Eos: 3 %
Hematocrit: 39.9 % (ref 37.5–51.0)
Hemoglobin: 13.6 g/dL (ref 13.0–17.7)
Immature Grans (Abs): 0 10*3/uL (ref 0.0–0.1)
Immature Granulocytes: 0 %
Lymphocytes Absolute: 1.6 10*3/uL (ref 0.7–3.1)
Lymphs: 27 %
MCH: 31.4 pg (ref 26.6–33.0)
MCHC: 34.1 g/dL (ref 31.5–35.7)
MCV: 92 fL (ref 79–97)
Monocytes Absolute: 0.5 10*3/uL (ref 0.1–0.9)
Monocytes: 9 %
Neutrophils Absolute: 3.5 10*3/uL (ref 1.4–7.0)
Neutrophils: 61 %
Platelets: 228 10*3/uL (ref 150–450)
RBC: 4.33 x10E6/uL (ref 4.14–5.80)
RDW: 11.9 % (ref 11.6–15.4)
WBC: 5.7 10*3/uL (ref 3.4–10.8)

## 2019-08-25 LAB — COMPREHENSIVE METABOLIC PANEL
ALT: 15 IU/L (ref 0–44)
AST: 20 IU/L (ref 0–40)
Albumin/Globulin Ratio: 1.8 (ref 1.2–2.2)
Albumin: 4.2 g/dL (ref 3.8–4.8)
Alkaline Phosphatase: 45 IU/L — ABNORMAL LOW (ref 48–121)
BUN/Creatinine Ratio: 17 (ref 10–24)
BUN: 21 mg/dL (ref 8–27)
Bilirubin Total: 0.4 mg/dL (ref 0.0–1.2)
CO2: 22 mmol/L (ref 20–29)
Calcium: 8.9 mg/dL (ref 8.6–10.2)
Chloride: 106 mmol/L (ref 96–106)
Creatinine, Ser: 1.27 mg/dL (ref 0.76–1.27)
GFR calc Af Amer: 70 mL/min/{1.73_m2} (ref >59)
GFR calc non Af Amer: 60 mL/min/{1.73_m2} (ref >59)
Globulin, Total: 2.4 g/dL (ref 1.5–4.5)
Glucose: 89 mg/dL (ref 65–99)
Potassium: 4.4 mmol/L (ref 3.5–5.2)
Sodium: 140 mmol/L (ref 134–144)
Total Protein: 6.6 g/dL (ref 6.0–8.5)

## 2019-08-25 LAB — LIPID PANEL WITH LDL/HDL RATIO
Cholesterol, Total: 124 mg/dL (ref 100–199)
HDL: 38 mg/dL — ABNORMAL LOW (ref >39)
LDL Chol Calc (NIH): 66 mg/dL (ref 0–99)
LDL/HDL Ratio: 1.7 ratio (ref 0.0–3.6)
Triglycerides: 107 mg/dL (ref 0–149)
VLDL Cholesterol Cal: 20 mg/dL (ref 5–40)

## 2019-08-25 LAB — PSA: Prostate Specific Ag, Serum: 0.7 ng/mL (ref 0.0–4.0)

## 2019-08-25 LAB — TSH: TSH: 1.85 u[IU]/mL (ref 0.450–4.500)

## 2019-08-27 ENCOUNTER — Telehealth: Payer: Self-pay

## 2019-08-27 NOTE — Telephone Encounter (Signed)
Patient advised of lab results and expressed verbal understanding. 

## 2019-08-27 NOTE — Telephone Encounter (Signed)
-----   Message from Maple Hudson., MD sent at 08/27/2019  7:49 AM EDT ----- Labs improved, cholesterol at goal of LDL less than 70

## 2019-12-31 ENCOUNTER — Ambulatory Visit: Payer: Self-pay | Admitting: Family Medicine

## 2019-12-31 NOTE — Telephone Encounter (Signed)
Pt. Called to report he tested positive for COVID, via a Home test kit, last night.  Reported onset of sneezing, runny nose, body aches, and infreq. dry cough on Christmas day. Does not think he is running a fever.  Denied any shortness of breath or chest tightness.  Is asking if Dr. Rosanna Randy has any specific recommendations for him.  Advised of CDC guidelines for quarantining x 10 days from onset of sx's.  Advised to rest, stay well hydrated, and can take OTC medications for congestion / cough.  Pt. Stated he does not have any blood pressure issues.  Advised to check with his Pharmacist re: safe medications for him to take with hx of cardiac stents.  Pt. Stated he has been taking Corcidin HBP.  Advised to use humidifier at bedside to reduce irritation of lining of nose/ throat, and to help to thin secretions.  Advised to only go out to seek emergency care for increased shortness of breath, weakness, or chest tightness, or if has high fever that does not respond to Tylenol.  Pt. Verb. Understanding.  Pt. Questioned about if he is a candidate for the [facility] Infusion.  Advised will send a message to the [facility] with his information, and to expect a call back from them.  Also advised will send this note to Dr. Rosanna Randy, to make aware of his symptoms.  Verb. Understanding and agreed with plan.      Reason for Disposition . [1] HIGH RISK patient AND [2] influenza is widespread in the community AND [3] ONE OR MORE respiratory symptoms: cough, sore throat, runny or stuffy nose  Answer Assessment - Initial Assessment Questions 1. COVID-19 DIAGNOSIS: "Who made your COVID-19 diagnosis?" "Was it confirmed by a positive lab test?" If not diagnosed by a HCP, ask "Are there lots of cases (community spread) where you live?" Note: See public health department website, if unsure.     Tested positive at home  2. COVID-19 EXPOSURE: "Was there any known exposure to COVID before the symptoms began?" CDC Definition of close  contact: within 6 feet (2 meters) for a total of 15 minutes or more over a 24-hour period.      Exposed by daughter and son-in-law 3. ONSET: "When did the COVID-19 symptoms start?"      On Christmas day 4. WORST SYMPTOM: "What is your worst symptom?" (e.g., cough, fever, shortness of breath, muscle aches)     Runny nose and headache 5. COUGH: "Do you have a cough?" If Yes, ask: "How bad is the cough?"       Infreq. Cough; dry  6. FEVER: "Do you have a fever?" If Yes, ask: "What is your temperature, how was it measured, and when did it start?"     Denied  7. RESPIRATORY STATUS: "Describe your breathing?" (e.g., shortness of breath, wheezing, unable to speak)      Denied SOB or wheezing 8. BETTER-SAME-WORSE: "Are you getting better, staying the same or getting worse compared to yesterday?"  If getting worse, ask, "In what way?"     Runny nose and headache; feeling more pronounced  9. HIGH RISK DISEASE: "Do you have any chronic medical problems?" (e.g., asthma, heart or lung disease, weak immune system, obesity, etc.)     Hx of cardiac stents; denied lung issues; denied any weakness in the immune system  10. VACCINE: "Have you gotten the COVID-19 vaccine?" If Yes ask: "Which one, how many shots, when did you get it?"  Has not been vaccinated  58. PREGNANCY: "Is there any chance you are pregnant?" "When was your last menstrual period?"       N/a  12. OTHER SYMPTOMS: "Do you have any other symptoms?"  (e.g., chills, fatigue, headache, loss of smell or taste, muscle pain, sore throat; new loss of smell or taste especially support the diagnosis of COVID-19)       Body aches, runny nose, intermittent, infreq. cough and headache. Denied any chest tightness or shortness of breath  Protocols used: CORONAVIRUS (COVID-19) DIAGNOSED OR SUSPECTED-A-AH

## 2020-01-01 ENCOUNTER — Telehealth: Payer: Self-pay | Admitting: Physician Assistant

## 2020-01-01 ENCOUNTER — Encounter: Payer: Self-pay | Admitting: Physician Assistant

## 2020-01-01 DIAGNOSIS — E669 Obesity, unspecified: Secondary | ICD-10-CM | POA: Insufficient documentation

## 2020-01-01 DIAGNOSIS — G4730 Sleep apnea, unspecified: Secondary | ICD-10-CM | POA: Insufficient documentation

## 2020-01-01 DIAGNOSIS — I2510 Atherosclerotic heart disease of native coronary artery without angina pectoris: Secondary | ICD-10-CM | POA: Insufficient documentation

## 2020-01-01 DIAGNOSIS — I10 Essential (primary) hypertension: Secondary | ICD-10-CM | POA: Insufficient documentation

## 2020-01-01 DIAGNOSIS — E785 Hyperlipidemia, unspecified: Secondary | ICD-10-CM | POA: Insufficient documentation

## 2020-01-01 NOTE — Telephone Encounter (Addendum)
Called to discuss with Fernande Boyden Hepler about Covid symptoms and the use of sotrovimab, casirivimab/imdevimab or bamlanivimab/etesevimab infusion for those with mild to moderate Covid symptoms and at a high risk of hospitalization.     Pt is qualified for this infusion at the monoclonal antibody infusion center due to co-morbid conditions and/or a member of an at-risk group (HTN, BMI>25, unvaccinated), however would like to think more about the infusion at this time. Symptoms tier reviewed as well as criteria for ending isolation.  Symptoms reviewed that would warrant ED/Hospital evaluation. Preventative practices reviewed. Patient verbalized understanding. Patient advised to call back if he decides that he does want to get infusion. Callback number to the infusion center given. Patient advised to go to Urgent care or ED with severe symptoms. Last date pt would be eligible for infusion is 1/9.   Pt would like to check with insurance company and call us back.   Patient Active Problem List   Diagnosis Date Noted  . CAD (coronary artery disease)   . Hyperlipidemia   . Hypertension   . Obesity   . Sleep apnea   . Arteriosclerosis of coronary artery 10/22/2014  . Herpes simplex 10/22/2014  . Failure of erection 10/22/2014  . HLD (hyperlipidemia) 10/22/2014  . BP (high blood pressure) 10/22/2014  . Adiposity 10/22/2014  . Obstructive apnea 10/22/2014    Cline Crock PA-C

## 2020-01-01 NOTE — Telephone Encounter (Signed)
Called to discuss with patient about Covid symptoms and the use of sotrovimab, bamlanivimab/etesevimab or casirivimab/imdevimab, a monoclonal antibody infusion for those with mild to moderate Covid symptoms and at a high risk of hospitalization.  Pt is qualified for this infusion at the [facility] center due to; Specific high risk criteria : BMI > 25, HTN. Need to clarify if pt is vaccinated or not.   Sx onset 12/25 and tested by home test per Dr. Wonda Olds notes.    Message left to call back our hotline [phone]. My chart message sent if active on Mychart.   Cline Crock PA-C

## 2020-01-01 NOTE — Telephone Encounter (Signed)
Patient was advised to treat symptoms with OTC medication and contact office for worsening symptoms.  Be seen in ER or UC for severe symptoms.

## 2020-03-17 ENCOUNTER — Other Ambulatory Visit: Payer: Self-pay | Admitting: Family Medicine

## 2020-03-17 DIAGNOSIS — I2510 Atherosclerotic heart disease of native coronary artery without angina pectoris: Secondary | ICD-10-CM

## 2020-03-17 NOTE — Telephone Encounter (Signed)
Requested medication (s) are due for refill today: yes  Requested medication (s) are on the active medication list: yes  Last refill:  12/13/2019  Future visit scheduled: yes  Notes to clinic:  overdue for labs and follow up Patient is not schedule until 08/2020   Requested Prescriptions  Pending Prescriptions Disp Refills   clopidogrel (PLAVIX) 75 MG tablet [Pharmacy Med Name: CLOPIDOGREL 75 MG TABLET] 90 tablet 3    Sig: TAKE 1 TABLET BY MOUTH EVERY DAY      Hematology: Antiplatelets - clopidogrel Failed - 03/17/2020  1:20 AM      Failed - Evaluate AST, ALT within 2 months of therapy initiation.      Failed - HCT in normal range and within 180 days    Hematocrit  Date Value Ref Range Status  08/24/2019 39.9 37.5 - 51.0 % Final          Failed - HGB in normal range and within 180 days    Hemoglobin  Date Value Ref Range Status  08/24/2019 13.6 13.0 - 17.7 g/dL Final          Failed - PLT in normal range and within 180 days    Platelets  Date Value Ref Range Status  08/24/2019 228 150 - 450 x10E3/uL Final          Failed - Valid encounter within last 6 months    Recent Outpatient Visits           7 months ago Annual physical exam   [facility] Maple Hudson., MD   1 year ago Other hyperlipidemia   [facility] Maple Hudson., MD   1 year ago Annual physical exam   [facility] Maple Hudson., MD   2 years ago Annual physical exam   [facility] Maple Hudson., MD   3 years ago Annual physical exam   [facility] Maple Hudson., MD       Future Appointments             In 4 months Maple Hudson., MD [facility]             Passed - ALT in normal range and within 360 days    ALT  Date Value Ref Range Status  08/24/2019 15 0 - 44 IU/L Final          Passed - AST in normal range and within 360 days    AST   Date Value Ref Range Status  08/24/2019 20 0 - 40 IU/L Final

## 2020-05-15 ENCOUNTER — Telehealth: Payer: Self-pay

## 2020-05-15 NOTE — Telephone Encounter (Signed)
Copied from CRM [id number]. Topic: General - Other >> May 15, 2020  4:26 PM Elliot Gault wrote: Caller states patient was under Lamar Blinks, MD cardiology care but since insurance has changed patient is in need of a new cardiologist. Please send a referral to [facility] cardiology fax # [phone]. Patient is due for a follow up with specialist. [facility] Cardiology telephone # [phone]. Please follow up with caller or patient once referral has been placed

## 2020-05-16 NOTE — Telephone Encounter (Signed)
Ok to place cardio referral? Please advise. Thanks!

## 2020-05-16 NOTE — Telephone Encounter (Signed)
ok 

## 2020-05-19 ENCOUNTER — Other Ambulatory Visit: Payer: Self-pay | Admitting: *Deleted

## 2020-05-19 DIAGNOSIS — I2510 Atherosclerotic heart disease of native coronary artery without angina pectoris: Secondary | ICD-10-CM

## 2020-05-19 NOTE — Telephone Encounter (Signed)
Referral ordered. Patient's wife was advised.

## 2020-08-07 ENCOUNTER — Encounter: Payer: Self-pay | Admitting: Family Medicine

## 2020-09-22 ENCOUNTER — Other Ambulatory Visit: Payer: Self-pay | Admitting: Family Medicine

## 2020-09-22 DIAGNOSIS — E7849 Other hyperlipidemia: Secondary | ICD-10-CM

## 2020-09-27 ENCOUNTER — Other Ambulatory Visit: Payer: Self-pay | Admitting: Family Medicine

## 2020-09-27 NOTE — Telephone Encounter (Signed)
Requested medication (s) are due for refill today Undetermined  Requested medication (s) are on the active medication list No. Current list includes Zetia and Lipitor  Future visit scheduled  No  Note to clinic-labs overdue-does not meet protocol requirements, Crestor is not on the patient's current Medication list.It is listed in the patient's chart-a new prescription is needed on file. Called patient and left VM to schedule yearly appointment. LOV 08/07/19.Routing to physician for review.   Requested Prescriptions  Pending Prescriptions Disp Refills   rosuvastatin (CRESTOR) 20 MG tablet [Pharmacy Med Name: ROSUVASTATIN CALCIUM 20 MG TAB] 30 tablet 0    Sig: TAKE 1 TABLET BY MOUTH EVERYDAY AT BEDTIME     Cardiovascular:  Antilipid - Statins Failed - 09/27/2020 10:33 AM      Failed - Total Cholesterol in normal range and within 360 days    Cholesterol, Total  Date Value Ref Range Status  08/24/2019 124 100 - 199 mg/dL Final          Failed - LDL in normal range and within 360 days    LDL Chol Calc (NIH)  Date Value Ref Range Status  08/24/2019 66 0 - 99 mg/dL Final          Failed - HDL in normal range and within 360 days    HDL  Date Value Ref Range Status  08/24/2019 38 (L) >39 mg/dL Final          Failed - Triglycerides in normal range and within 360 days    Triglycerides  Date Value Ref Range Status  08/24/2019 107 0 - 149 mg/dL Final          Failed - Valid encounter within last 12 months    Recent Outpatient Visits           1 year ago Annual physical exam   [facility] Maple Hudson., MD   1 year ago Other hyperlipidemia   [facility] Maple Hudson., MD   2 years ago Annual physical exam   [facility] Maple Hudson., MD   3 years ago Annual physical exam   [facility] Maple Hudson., MD   4 years ago Annual physical exam   [facility] Maple Hudson., MD              Passed - Patient is not pregnant

## 2020-12-31 ENCOUNTER — Other Ambulatory Visit: Payer: Self-pay | Admitting: Family Medicine

## 2021-02-23 ENCOUNTER — Other Ambulatory Visit: Payer: Self-pay | Admitting: Family Medicine

## 2021-02-23 DIAGNOSIS — E7849 Other hyperlipidemia: Secondary | ICD-10-CM

## 2021-03-22 ENCOUNTER — Other Ambulatory Visit: Payer: Self-pay | Admitting: Family Medicine

## 2021-03-22 DIAGNOSIS — I2510 Atherosclerotic heart disease of native coronary artery without angina pectoris: Secondary | ICD-10-CM

## 2021-07-27 ENCOUNTER — Other Ambulatory Visit: Payer: Self-pay | Admitting: Family Medicine

## 2021-07-27 DIAGNOSIS — E7849 Other hyperlipidemia: Secondary | ICD-10-CM
